# Patient Record
Sex: Male | Born: 1985 | Race: White | Hispanic: No | Marital: Married | Smoking: Never smoker
Health system: Southern US, Community
[De-identification: ages and names within clinical notes are randomized; demographics above are authoritative.]

## PROBLEM LIST (undated history)

## (undated) DIAGNOSIS — M5020 Other cervical disc displacement, unspecified cervical region: Secondary | ICD-10-CM

## (undated) DIAGNOSIS — F988 Other specified behavioral and emotional disorders with onset usually occurring in childhood and adolescence: Secondary | ICD-10-CM

## (undated) DIAGNOSIS — G709 Myoneural disorder, unspecified: Secondary | ICD-10-CM

## (undated) HISTORY — DX: Other cervical disc displacement, unspecified cervical region: M50.20

## (undated) HISTORY — PX: WISDOM TOOTH EXTRACTION: SHX21

## (undated) HISTORY — DX: Other specified behavioral and emotional disorders with onset usually occurring in childhood and adolescence: F98.8

## (undated) HISTORY — DX: Myoneural disorder, unspecified: G70.9

---

## 2013-11-14 ENCOUNTER — Ambulatory Visit
Admission: RE | Admit: 2013-11-14 | Discharge: 2013-11-14 | Disposition: A | Discharge status: Home / Self Care | Payer: Managed Care, Other (non HMO) | Source: Ambulatory Visit | Attending: Emergency Medicine | Admitting: Emergency Medicine

## 2013-11-14 ENCOUNTER — Other Ambulatory Visit: Payer: Self-pay | Admitting: Emergency Medicine

## 2013-11-14 DIAGNOSIS — M542 Cervicalgia: Secondary | ICD-10-CM

## 2013-11-14 DIAGNOSIS — M25512 Pain in left shoulder: Secondary | ICD-10-CM

## 2014-02-14 HISTORY — PX: EPIDURAL BLOCK INJECTION: SHX1516

## 2015-04-30 IMAGING — CR DG SHOULDER 2+V*L*
3 series · 3 of 3 positions shown · non-contrast
Comparison: None.

CLINICAL DATA: 28-year-old male with left shoulder pain.

EXAM:
LEFT SHOULDER - 2+ VIEW

[view not recorded (1 of 3)]
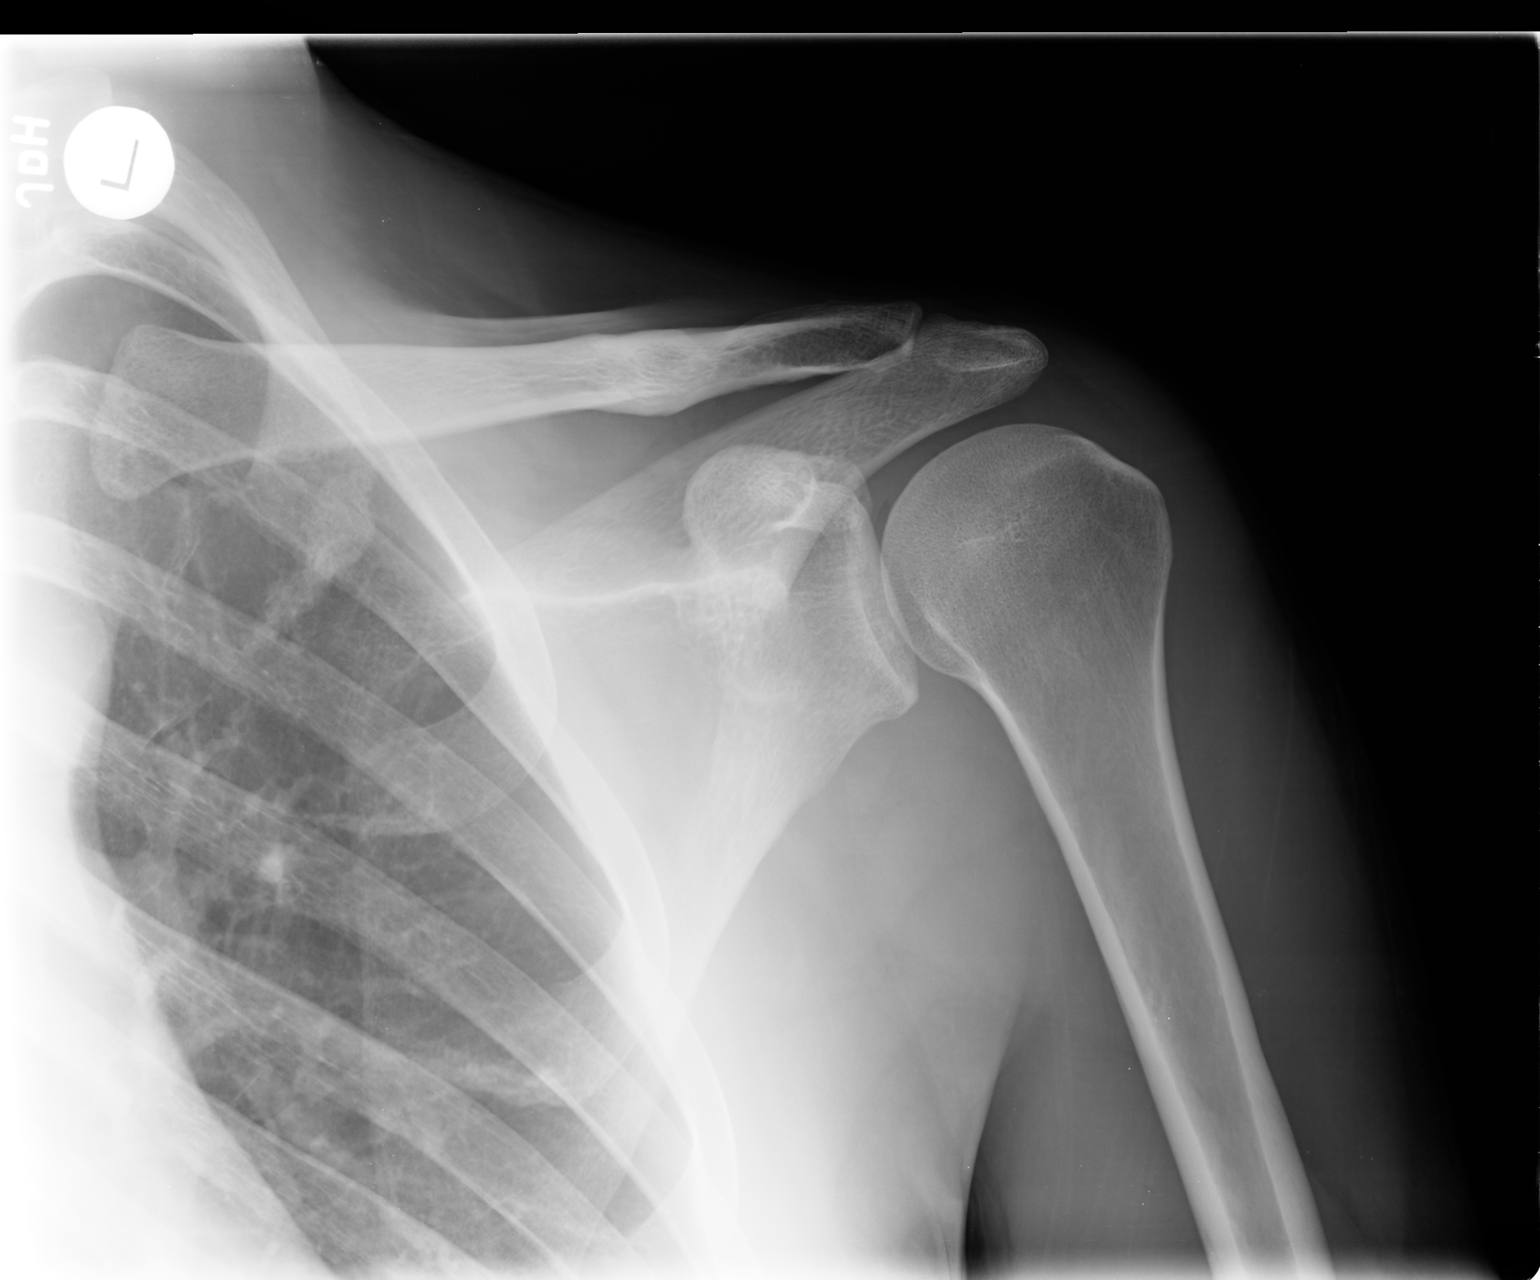

[view not recorded (2 of 3)]
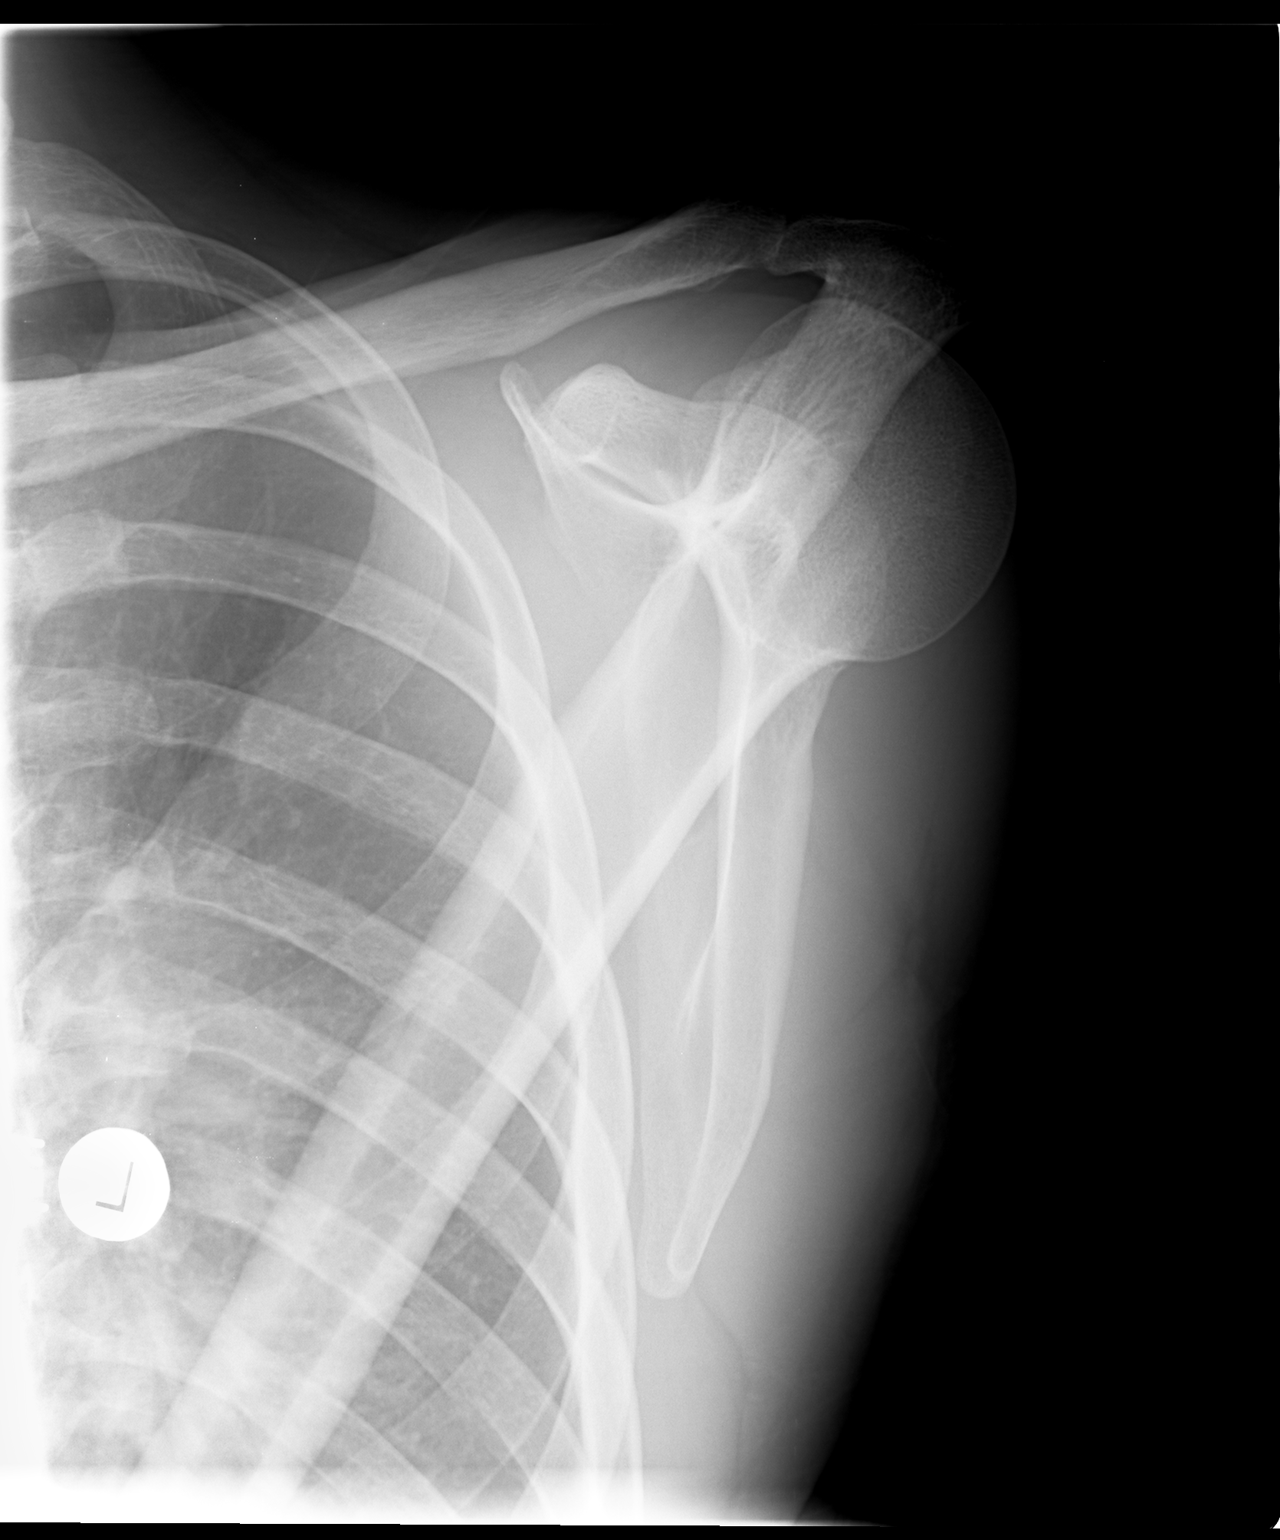

[view not recorded (3 of 3)]
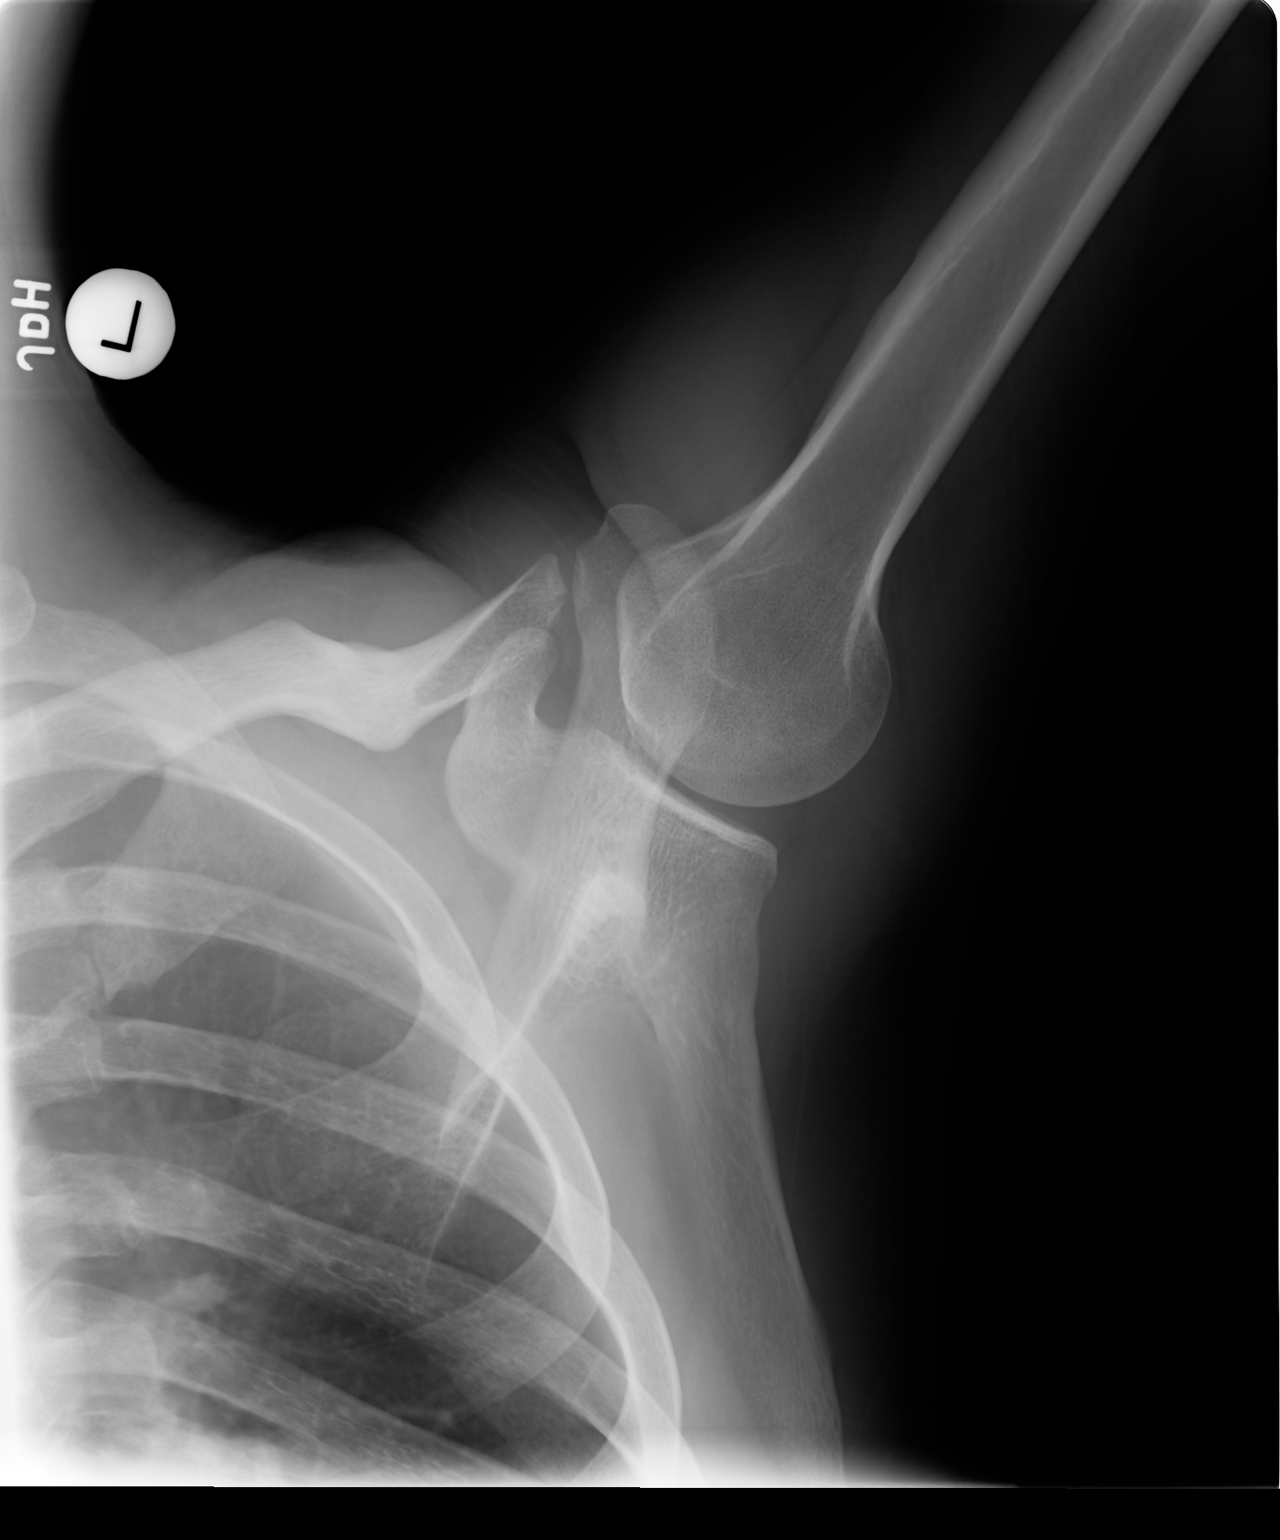

[3 of 3 positions shown; findings below may reference images not displayed]

FINDINGS: No acute bony abnormality. Glenohumeral joint is located. No
significant soft tissue swelling. No joint effusion. No radiopaque
foreign body.

Visualized aspects of the left hemithorax unremarkable.
IMPRESSION: No acute abnormality identified.

## 2015-04-30 IMAGING — CR DG CERVICAL SPINE COMPLETE 4+V
5 series · 5 of 5 positions shown · non-contrast
Comparison: None.

CLINICAL DATA: Neck pain, initial visit. Sharp pain when flexing.
Radiation of pain to left shoulder. No trauma.

EXAM:
CERVICAL SPINE  4+ VIEWS

[view not recorded (1 of 5)]
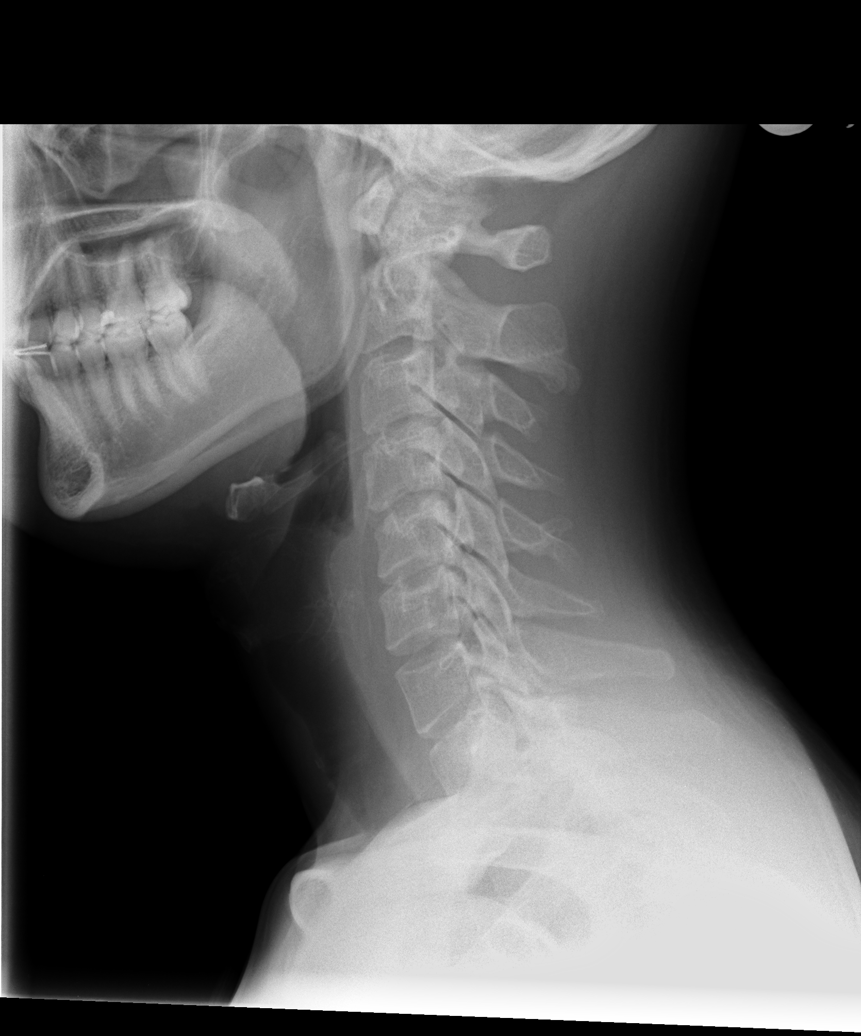

[view not recorded (2 of 5)]
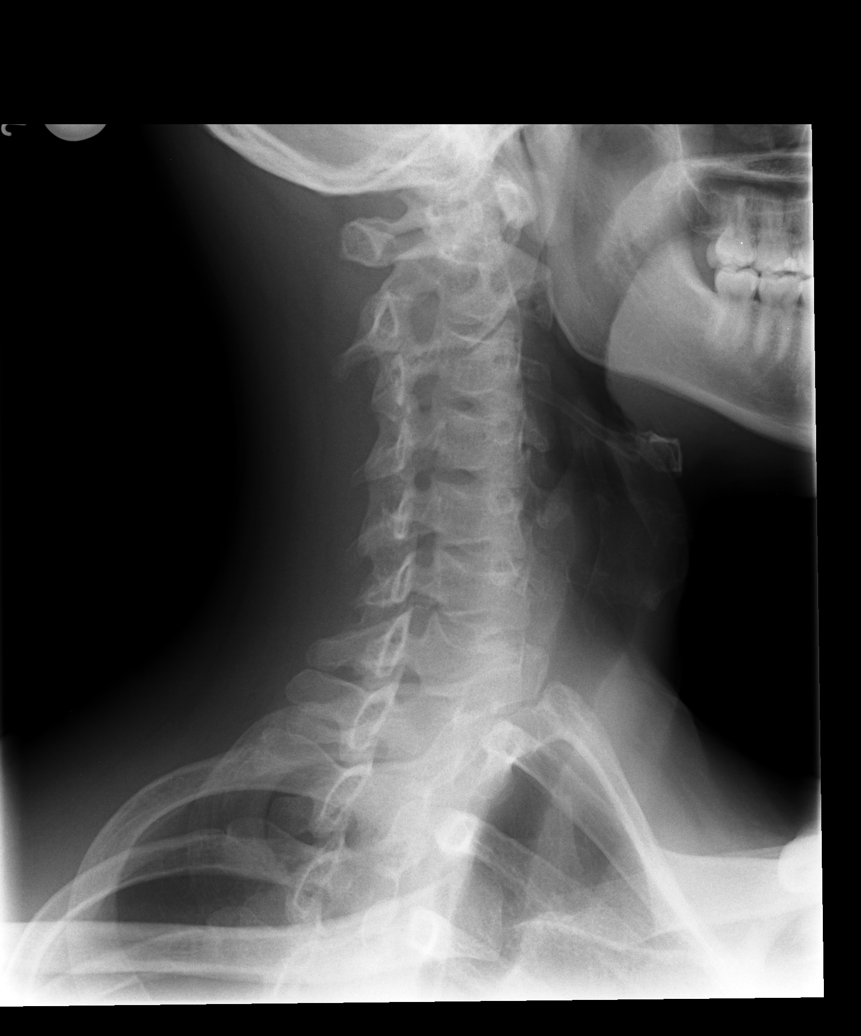

[view not recorded (3 of 5)]
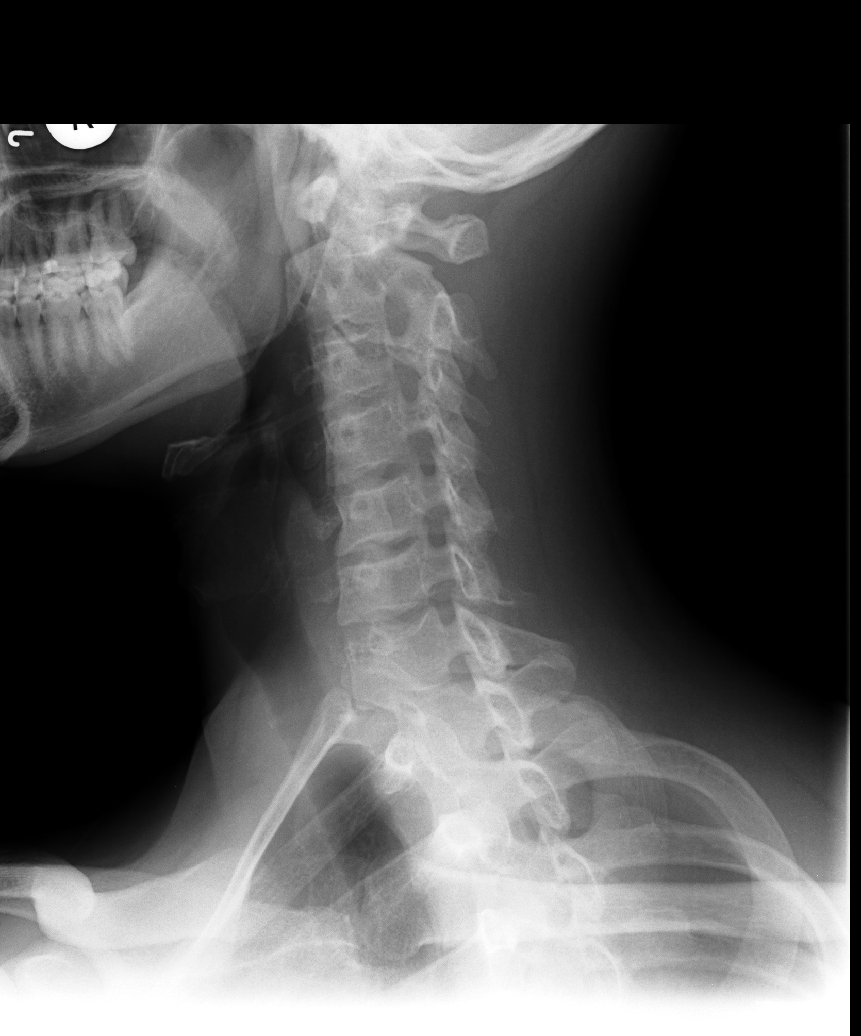

[view not recorded (4 of 5)]
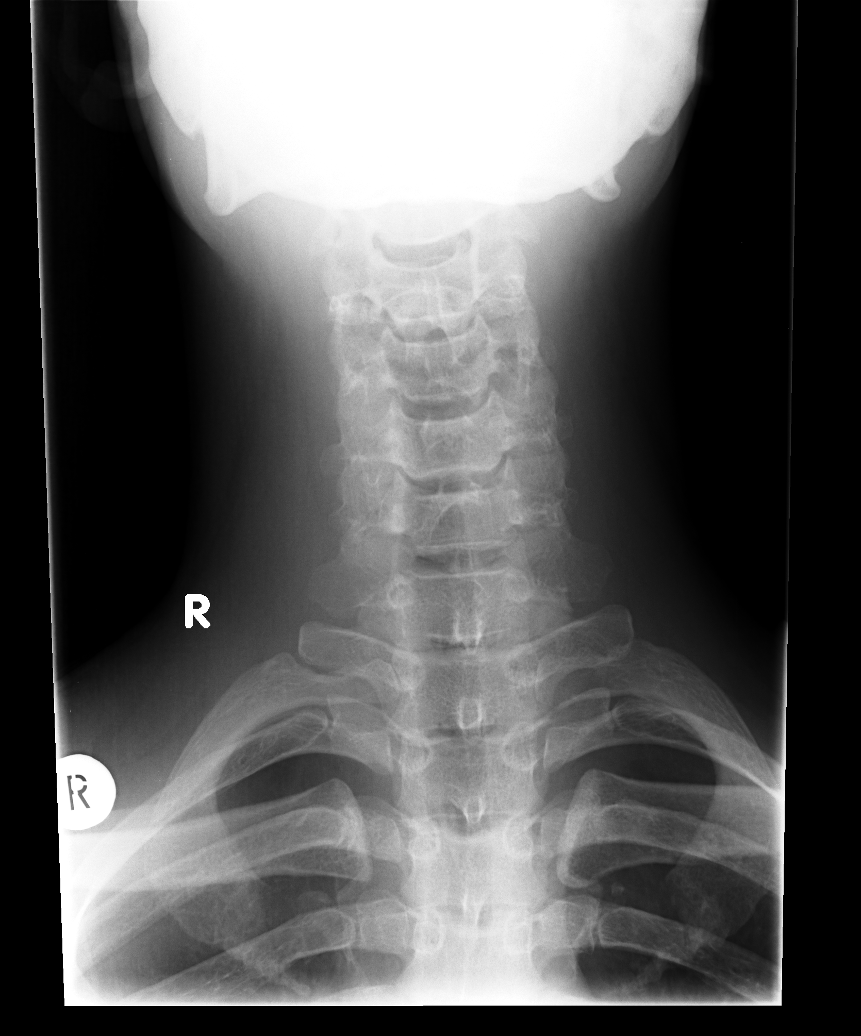

[view not recorded (5 of 5)]
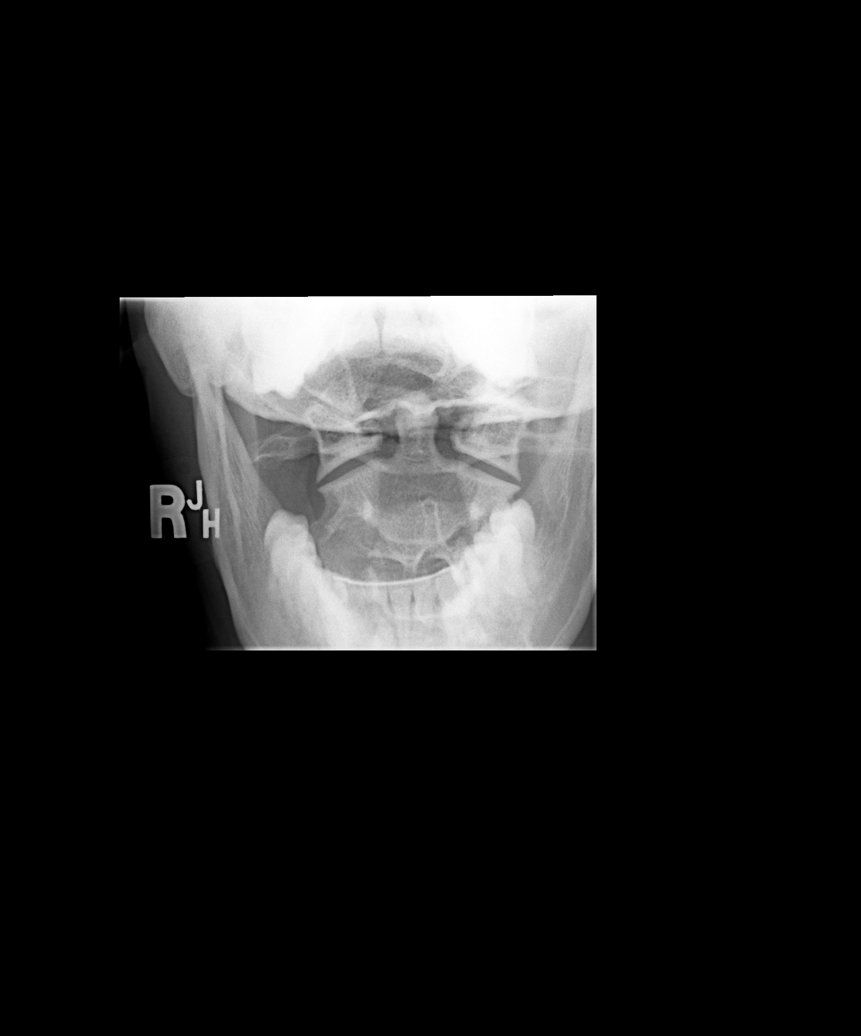

[5 of 5 positions shown; findings below may reference images not displayed]

FINDINGS: Diffuse mild degenerative change of the cervical spine. No acute
bony abnormality identified. No evidence of fracture dislocation.
Pulmonary apices are clear.
IMPRESSION: No acute abnormality.

## 2017-02-14 HISTORY — PX: COLONOSCOPY: SHX174

## 2017-02-14 HISTORY — PX: POLYPECTOMY: SHX149

## 2017-06-20 ENCOUNTER — Encounter: Payer: Self-pay | Admitting: Gastroenterology

## 2017-06-20 ENCOUNTER — Encounter: Payer: Self-pay | Admitting: Family Medicine

## 2017-06-20 ENCOUNTER — Ambulatory Visit: Payer: Commercial Managed Care - PPO | Admitting: Family Medicine

## 2017-06-20 VITALS — BP 102/62 | HR 81 | Temp 97.4°F | Resp 14 | Ht 72.0 in | Wt 200.0 lb

## 2017-06-20 DIAGNOSIS — Z0001 Encounter for general adult medical examination with abnormal findings: Secondary | ICD-10-CM | POA: Diagnosis not present

## 2017-06-20 DIAGNOSIS — Z0184 Encounter for antibody response examination: Secondary | ICD-10-CM

## 2017-06-20 DIAGNOSIS — M5020 Other cervical disc displacement, unspecified cervical region: Secondary | ICD-10-CM | POA: Insufficient documentation

## 2017-06-20 DIAGNOSIS — Z114 Encounter for screening for human immunodeficiency virus [HIV]: Secondary | ICD-10-CM | POA: Diagnosis not present

## 2017-06-20 DIAGNOSIS — L989 Disorder of the skin and subcutaneous tissue, unspecified: Secondary | ICD-10-CM

## 2017-06-20 DIAGNOSIS — Z8659 Personal history of other mental and behavioral disorders: Secondary | ICD-10-CM

## 2017-06-20 DIAGNOSIS — K625 Hemorrhage of anus and rectum: Secondary | ICD-10-CM

## 2017-06-20 DIAGNOSIS — Z13220 Encounter for screening for lipoid disorders: Secondary | ICD-10-CM | POA: Diagnosis not present

## 2017-06-20 LAB — CBC
HEMATOCRIT: 44.2 % (ref 39.0–52.0)
HEMOGLOBIN: 14.9 g/dL (ref 13.0–17.0)
MCHC: 33.7 g/dL (ref 30.0–36.0)
MCV: 88.5 fl (ref 78.0–100.0)
PLATELETS: 156 10*3/uL (ref 150.0–400.0)
RBC: 4.99 Mil/uL (ref 4.22–5.81)
RDW: 13.3 % (ref 11.5–15.5)
WBC: 6.6 10*3/uL (ref 4.0–10.5)

## 2017-06-20 LAB — LIPID PANEL
Cholesterol: 148 mg/dL (ref 0–200)
HDL: 47.6 mg/dL (ref >39.00)
LDL CALC: 83 mg/dL (ref 0–99)
NONHDL: 99.93
Total CHOL/HDL Ratio: 3
Triglycerides: 84 mg/dL (ref 0.0–149.0)
VLDL: 16.8 mg/dL (ref 0.0–40.0)

## 2017-06-20 LAB — COMPREHENSIVE METABOLIC PANEL
ALK PHOS: 36 U/L — AB (ref 39–117)
ALT: 12 U/L (ref 0–53)
AST: 12 U/L (ref 0–37)
Albumin: 4.6 g/dL (ref 3.5–5.2)
BILIRUBIN TOTAL: 0.6 mg/dL (ref 0.2–1.2)
BUN: 14 mg/dL (ref 6–23)
CO2: 30 mEq/L (ref 19–32)
Calcium: 9.8 mg/dL (ref 8.4–10.5)
Chloride: 104 mEq/L (ref 96–112)
Creatinine, Ser: 0.9 mg/dL (ref 0.40–1.50)
GFR: 104.69 mL/min (ref >60.00)
GLUCOSE: 89 mg/dL (ref 70–99)
POTASSIUM: 4.2 meq/L (ref 3.5–5.1)
Sodium: 141 mEq/L (ref 135–145)
TOTAL PROTEIN: 7.1 g/dL (ref 6.0–8.3)

## 2017-06-20 LAB — TSH: TSH: 1.62 u[IU]/mL (ref 0.35–4.50)

## 2017-06-20 NOTE — Progress Notes (Signed)
 Subjective:  Jesus Schneider is a 32 y.o. male who presents today for his annual comprehensive physical exam and to establish care.  HPI:  He has 2 minor complaints today:  1.  Rectal bleeding.  Patient noted bright red blood after bowel movement about a week ago.  This is a one-time occurrence.  Since then has not had any bleeding.  No pain.  No constipation or diarrhea.  Patient has family history of colon cancer in his mother who was diagnosed at age 55. 2.  Skin lesion.  Noticed on the bottom of his right foot about a week ago.  No obvious precipitating events.  No treatments tried.  Lifestyle Diet: No specific diets.  Exercise: Tries to walk everyday. Light running and jogging.   Depression screen PHQ 2/9 06/20/2017  Decreased Interest 0  Down, Depressed, Hopeless 1  PHQ - 2 Score 1   Health Maintenance Due  Topic Date Due  . HIV Screening  07/31/2001  . TETANUS/TDAP  07/31/2005    ROS: Per HPI, otherwise a complete review of systems was negative.   PMH:  The following were reviewed and entered/updated in epic: History reviewed. No pertinent past medical history. Patient Active Problem List   Diagnosis Date Noted  . Cervical herniated disc 06/20/2017  . History of ADHD 06/20/2017   Past Surgical History:  Procedure Laterality Date  . WISDOM TOOTH EXTRACTION      Family History  Problem Relation Age of Onset  . Cancer Mother        colon cancer  . Hypertension Mother   . Heart disease Maternal Grandmother   . Heart disease Maternal Grandfather     Medications- reviewed and updated Current Outpatient Medications  Medication Sig Dispense Refill  . Multiple Vitamin (MULTIVITAMIN) capsule Take 1 capsule by mouth daily.     No current facility-administered medications for this visit.     Allergies-reviewed and updated No Known Allergies  Social History   Socioeconomic History  . Marital status: Married    Spouse name: Not on file  . Number of children:  1  . Years of education: Not on file  . Highest education level: Not on file  Occupational History  . Not on file  Social Needs  . Financial resource strain: Not on file  . Food insecurity:    Worry: Not on file    Inability: Not on file  . Transportation needs:    Medical: Not on file    Non-medical: Not on file  Tobacco Use  . Smoking status: Never Smoker  . Smokeless tobacco: Never Used  Substance and Sexual Activity  . Alcohol use: Yes    Comment: 3 per week  . Drug use: Never  . Sexual activity: Yes    Partners: Female    Birth control/protection: Other-see comments    Comment: birth of son 04/16/2017  Lifestyle  . Physical activity:    Days per week: Not on file    Minutes per session: Not on file  . Stress: Not on file  Relationships  . Social connections:    Talks on phone: Not on file    Gets together: Not on file    Attends religious service: Not on file    Active member of club or organization: Not on file    Attends meetings of clubs or organizations: Not on file    Relationship status: Not on file  Other Topics Concern  . Not on file  Social History   Narrative  . Not on file    Objective:  Physical Exam: BP 102/62   Pulse 81   Temp (!) 97.4 F (36.3 C) (Oral)   Resp 14   Ht 6' (1.829 m)   Wt 200 lb (90.7 kg)   SpO2 99%   BMI 27.12 kg/m   Body mass index is 27.12 kg/m. Wt Readings from Last 3 Encounters:  06/20/17 200 lb (90.7 kg)   Gen: NAD, resting comfortably HEENT: TMs normal bilaterally. OP clear. No thyromegaly noted.  CV: RRR with no murmurs appreciated Pulm: NWOB, CTAB with no crackles, wheezes, or rhonchi GI: Normal bowel sounds present. Soft, Nontender, Nondistended. GU: Normal anus without hemorrhoid or fissure noted. MSK: no edema, cyanosis, or clubbing noted Skin: warm, dry.  3 discrete hyperpigmented vesicles approximately 5 to 10 mm in diameter on sole of right foot.  Please see below picture. Neuro: CN2-12 grossly intact.  Strength 5/5 in upper and lower extremities. Reflexes symmetric and intact bilaterally.  Psych: Normal affect and thought content      Assessment/Plan:  Skin lesion Unclear etiology.  Possibly mild contact dermatitis versus friction dermatitis.  Given that symptoms are improving, we will proceed with watchful waiting.  Discussed return precautions.  Consider biopsy if worsens or does not improve over the next several weeks.  Rectal bleeding Exam today without abnormalities.  Symptoms likely secondary to internal hemorrhoids, however given family history of colon cancer, will place referral to GI today.  Check CBC.  History of ADHD Not currently on any medications.  Check TSH, CBC, and CMET.  Preventative Healthcare: Check HIV antibody.  Check lipid panel.  Check MMR immunity status per patient request.  Patient Counseling(The following topics were reviewed and/or handout was given):  -Nutrition: Stressed importance of moderation in sodium/caffeine intake, saturated fat and cholesterol, caloric balance, sufficient intake of fresh fruits, vegetables, and fiber.  -Stressed the importance of regular exercise.   -Substance Abuse: Discussed cessation/primary prevention of tobacco, alcohol, or other drug use; driving or other dangerous activities under the influence; availability of treatment for abuse.   -Injury prevention: Discussed safety belts, safety helmets, smoke detector, smoking near bedding or upholstery.   -Sexuality: Discussed sexually transmitted diseases, partner selection, use of condoms, avoidance of unintended pregnancy and contraceptive alternatives.   -Dental health: Discussed importance of regular tooth brushing, flossing, and dental visits.  -Health maintenance and immunizations reviewed. Please refer to Health maintenance section.  Return to care in 1 year for next preventative visit.   [name]. Jerline Pain, MD 06/20/2017 11:50 AM

## 2017-06-20 NOTE — Patient Instructions (Signed)
You may have an internal hemorrhoid. We can keep an eye on this for now. If it worsens or comes back we should send you to a GI specialist.  We will check blood work today.  Please let me know if the spot on your foot does not heal over the next few weeks.  Preventive Care 18-39 Years, Male Preventive care refers to lifestyle choices and visits with your health care provider that can promote health and wellness. What does preventive care include?  A yearly physical exam. This is also called an annual well check.  Dental exams once or twice a year.  Routine eye exams. Ask your health care provider how often you should have your eyes checked.  Personal lifestyle choices, including: ? Daily care of your teeth and gums. ? Regular physical activity. ? Eating a healthy diet. ? Avoiding tobacco and drug use. ? Limiting alcohol use. ? Practicing safe sex. What happens during an annual well check? The services and screenings done by your health care provider during your annual well check will depend on your age, overall health, lifestyle risk factors, and family history of disease. Counseling Your health care provider may ask you questions about your:  Alcohol use.  Tobacco use.  Drug use.  Emotional well-being.  Home and relationship well-being.  Sexual activity.  Eating habits.  Work and work Statistician.  Screening You may have the following tests or measurements:  Height, weight, and BMI.  Blood pressure.  Lipid and cholesterol levels. These may be checked every 5 years starting at age 21.  Diabetes screening. This is done by checking your blood sugar (glucose) after you have not eaten for a while (fasting).  Skin check.  Hepatitis C blood test.  Hepatitis B blood test.  Sexually transmitted disease (STD) testing.  Discuss your test results, treatment options, and if necessary, the need for more tests with your health care provider. Vaccines Your health  care provider may recommend certain vaccines, such as:  Influenza vaccine. This is recommended every year.  Tetanus, diphtheria, and acellular pertussis (Tdap, Td) vaccine. You may need a Td booster every 10 years.  Varicella vaccine. You may need this if you have not been vaccinated.  HPV vaccine. If you are 72 or younger, you may need three doses over 6 months.  Measles, mumps, and rubella (MMR) vaccine. You may need at least one dose of MMR.You may also need a second dose.  Pneumococcal 13-valent conjugate (PCV13) vaccine. You may need this if you have certain conditions and have not been vaccinated.  Pneumococcal polysaccharide (PPSV23) vaccine. You may need one or two doses if you smoke cigarettes or if you have certain conditions.  Meningococcal vaccine. One dose is recommended if you are age 66-21 years and a first-year college student living in a residence hall, or if you have one of several medical conditions. You may also need additional booster doses.  Hepatitis A vaccine. You may need this if you have certain conditions or if you travel or work in places where you may be exposed to hepatitis A.  Hepatitis B vaccine. You may need this if you have certain conditions or if you travel or work in places where you may be exposed to hepatitis B.  Haemophilus influenzae type b (Hib) vaccine. You may need this if you have certain risk factors.  Talk to your health care provider about which screenings and vaccines you need and how often you need them. This information is not intended  to replace advice given to you by your health care provider. Make sure you discuss any questions you have with your health care provider. Document Released: 03/29/2001 Document Revised: 10/21/2015 Document Reviewed: 12/02/2014 Elsevier Interactive Patient Education  [employer].

## 2017-06-21 LAB — MEASLES/MUMPS/RUBELLA IMMUNITY
Mumps IgG: 9.8 AU/mL — ABNORMAL LOW
Rubella: 1.24 index
Rubeola IgG: 74.3 AU/mL

## 2017-06-21 LAB — HIV ANTIBODY (ROUTINE TESTING W REFLEX): HIV: NONREACTIVE

## 2017-08-09 ENCOUNTER — Ambulatory Visit: Payer: Commercial Managed Care - PPO | Admitting: Gastroenterology

## 2017-08-09 ENCOUNTER — Encounter: Payer: Self-pay | Admitting: Gastroenterology

## 2017-08-09 VITALS — BP 100/70 | HR 76 | Ht 72.0 in | Wt 209.2 lb

## 2017-08-09 DIAGNOSIS — K625 Hemorrhage of anus and rectum: Secondary | ICD-10-CM

## 2017-08-09 MED ORDER — PEG 3350-KCL-NA BICARB-NACL 420 G PO SOLR: 4000.0000 mL | ORAL | 0 refills | Status: DC

## 2017-08-09 NOTE — Patient Instructions (Addendum)
You will be set up for a colonoscopy. For mild constipation, consider taking citrucel (orange flavored) powder fiber supplement.  This may cause some bloating at first but that usually goes away. Begin with a small spoonful and work your way up to a large, heaping spoonful daily over a week.  Normal BMI (Body Mass Index- based on height and weight) is between 19 and 25. Your BMI today is Body mass index is 28.37 kg/m. Marland Kitchen Please consider follow up  regarding your BMI with your Primary Care Provider.

## 2017-08-09 NOTE — Progress Notes (Signed)
HPI: This is a 32 year old manvery pleasant who was referred to me by Vivi Barrack, MD  to evaluate intermittent rectal bleeding.    Chief complaint is intermittent mild rectal bleeding  Intermittently over the past 6 or 7 years he has had very minor rectal bleeding at times of bowel movements.  This happens quite infrequently once or twice a year.  Can often happen after a difficult to move bowel movement.  He does think he had hemorrhoids when he was in college.  He has not felt hemorrhoidal type bumps however.  His weight has been overall stable  vyvance diarrhea 2010 until march 2019.  Has been a bit constipated since then, but that is getting better.  His mother had crc in her early 4s; she was treated by surgery and chemotherapy;   Old Data Reviewed:  CBC 06/2017 was normal.     Review of systems: Pertinent positive and negative review of systems were noted in the above HPI section. All other review negative.   Past Medical History:  Diagnosis Date  . ADD (attention deficit disorder)   . Cervical herniated disc     Past Surgical History:  Procedure Laterality Date  . WISDOM TOOTH EXTRACTION      Current Outpatient Medications  Medication Sig Dispense Refill  . Multiple Vitamin (MULTIVITAMIN) capsule Take 1 capsule by mouth daily.     No current facility-administered medications for this visit.     Allergies as of 08/09/2017  . (No Known Allergies)    Family History  Problem Relation Age of Onset  . Hypertension Mother   . Colon cancer Mother 92  . Colon polyps Mother   . Heart disease Maternal Grandmother   . Heart disease Maternal Grandfather     Social History   Socioeconomic History  . Marital status: Married    Spouse name: Not on file  . Number of children: 1  . Years of education: Not on file  . Highest education level: Not on file  Occupational History  . Occupation: Chief Financial Officer  Social Needs  . Financial resource strain: Not on file   . Food insecurity:    Worry: Not on file    Inability: Not on file  . Transportation needs:    Medical: Not on file    Non-medical: Not on file  Tobacco Use  . Smoking status: Never Smoker  . Smokeless tobacco: Never Used  Substance and Sexual Activity  . Alcohol use: Yes    Comment: 1-2 drinks daily  . Drug use: Never  . Sexual activity: Yes    Partners: Female    Birth control/protection: Other-see comments    Comment: birth of son 04/16/2017  Lifestyle  . Physical activity:    Days per week: Not on file    Minutes per session: Not on file  . Stress: Not on file  Relationships  . Social connections:    Talks on phone: Not on file    Gets together: Not on file    Attends religious service: Not on file    Active member of club or organization: Not on file    Attends meetings of clubs or organizations: Not on file    Relationship status: Not on file  . Intimate partner violence:    Fear of current or ex partner: Not on file    Emotionally abused: Not on file    Physically abused: Not on file    Forced sexual activity: Not on file  Other Topics Concern  . Not on file  Social History Narrative  . Not on file     Physical Exam: BP 100/70   Pulse 76   Ht 6' (1.829 m)   Wt 209 lb 3.2 oz (94.9 kg)   BMI 28.37 kg/m  Constitutional: generally well-appearing Psychiatric: alert and oriented x3 Eyes: extraocular movements intact Mouth: oral pharynx moist, no lesions Neck: supple no lymphadenopathy Cardiovascular: heart regular rate and rhythm Lungs: clear to auscultation bilaterally Abdomen: soft, nontender, nondistended, no obvious ascites, no peritoneal signs, normal bowel sounds Extremities: no lower extremity edema bilaterally Skin: no lesions on visible extremities Rectal examination: No external anal hemorrhoids, no obvious anal fissures, no distal rectal masses palpated, stool was brown and not checked for Hemoccult  Assessment and plan: 32 y.o. male with  minor rectal bleeding  I think this is probably benign, anorectal in origin.  Perhaps small internal hemorrhoid that is not palpable.  His mother had colon cancer in her very early 94W and I certainly think we should proceed with colonoscopy to be sure that we are not missing anything more significant here.  I also recommended he consider trying fiber supplements for mild functional sounding constipation   Please see the "Patient Instructions" section for addition details about the plan.   Owens Loffler, MD [facility] 08/09/2017, 8:31 AM  Cc: Vivi Barrack, MD

## 2017-10-20 ENCOUNTER — Ambulatory Visit (AMBULATORY_SURGERY_CENTER): Payer: Commercial Managed Care - PPO | Admitting: Gastroenterology

## 2017-10-20 ENCOUNTER — Encounter: Payer: Self-pay | Admitting: Gastroenterology

## 2017-10-20 VITALS — BP 96/56 | HR 60 | Temp 99.5°F | Resp 15 | Ht 72.0 in | Wt 209.0 lb

## 2017-10-20 DIAGNOSIS — Z800 Family history of malignant neoplasm of digestive organs: Secondary | ICD-10-CM

## 2017-10-20 DIAGNOSIS — D122 Benign neoplasm of ascending colon: Secondary | ICD-10-CM | POA: Diagnosis not present

## 2017-10-20 DIAGNOSIS — K625 Hemorrhage of anus and rectum: Secondary | ICD-10-CM

## 2017-10-20 MED ORDER — SODIUM CHLORIDE 0.9 % IV SOLN: 500.0000 mL | Freq: Once | INTRAVENOUS | Status: DC

## 2017-10-20 NOTE — Op Note (Signed)
[facility] Patient Name: Jesus Schneider Procedure Date: 10/20/2017 10:46 AM MRN: [id number] Endoscopist: Milus Banister , MD Age: 32 Referring MD:  Date of Birth: 1985/08/28 Gender: Male Account #: [id number] Procedure:                Colonoscopy Indications:              Rectal bleeding, also mother was diagnosed with                            colon cancer in her early 23s. Medicines:                Monitored Anesthesia Care Procedure:                Pre-Anesthesia Assessment:                           - Prior to the procedure, a History and Physical                            was performed, and patient medications and                            allergies were reviewed. The patient's tolerance of                            previous anesthesia was also reviewed. The risks                            and benefits of the procedure and the sedation                            options and risks were discussed with the patient.                            All questions were answered, and informed consent                            was obtained. Prior Anticoagulants: The patient has                            taken no previous anticoagulant or antiplatelet                            agents. ASA Grade Assessment: II - A patient with                            mild systemic disease. After reviewing the risks                            and benefits, the patient was deemed in                            satisfactory condition to undergo the procedure.  After obtaining informed consent, the colonoscope                            was passed under direct vision. Throughout the                            procedure, the patient's blood pressure, pulse, and                            oxygen saturations were monitored continuously. The                            Colonoscope was introduced through the anus and                            advanced to the the cecum,  identified by                            appendiceal orifice and ileocecal valve. The                            colonoscopy was performed without difficulty. The                            patient tolerated the procedure well. The quality                            of the bowel preparation was good. The ileocecal                            valve, appendiceal orifice, and rectum were                            photographed. Scope In: 10:52:28 AM Scope Out: 11:03:03 AM Scope Withdrawal Time: 0 hours 8 minutes 7 seconds  Total Procedure Duration: 0 hours 10 minutes 35 seconds  Findings:                 Three sessile polyps were found in the ascending                            colon. The polyps were 2 to 6 mm in size. These                            polyps were removed with a cold snare. Resection                            and retrieval were complete.                           The exam was otherwise without abnormality on                            direct and retroflexion views. Complications:  No immediate complications. Estimated blood loss:                            None. Estimated Blood Loss:     Estimated blood loss: none. Impression:               - Three 2 to 6 mm polyps in the ascending colon,                            removed with a cold snare. Resected and retrieved.                           - The examination was otherwise normal on direct                            and retroflexion views.                           - Your mild bleeding was not related to the polyps                            removed today. Much more likely is that the                            bleeding was from small hemorrhoids that have since                            resolved. Recommendation:           - Patient has a contact number available for                            emergencies. The signs and symptoms of potential                            delayed complications were discussed with the                             patient. Return to normal activities tomorrow.                            Written discharge instructions were provided to the                            patient.                           - Resume previous diet.                           - Continue present medications.                           You will receive a letter within 2-3 weeks with the  pathology results and my final recommendations.                           If the polyp(s) is proven to be 'pre-cancerous' on                            pathology, you will need repeat colonoscopy in 3-5                            years. If the polyp(s) is NOT 'precancerous' on                            pathology then you should repeat colon cancer                            screening at age 68 given your signficant family                            hitory of colon cancer. Milus Banister, MD 10/20/2017 11:07:02 AM This report has been signed electronically.

## 2017-10-20 NOTE — Patient Instructions (Signed)
   Information on polyps given to you today  Await pathology results on polyps removed    YOU HAD AN ENDOSCOPIC PROCEDURE TODAY AT THE [facility]:   Refer to the procedure report that was given to you for any specific questions about what was found during the examination.  If the procedure report does not answer your questions, please call your gastroenterologist to clarify.  If you requested that your care partner not be given the details of your procedure findings, then the procedure report has been included in a sealed envelope for you to review at your convenience later.  YOU SHOULD EXPECT: Some feelings of bloating in the abdomen. Passage of more gas than usual.  Walking can help get rid of the air that was put into your GI tract during the procedure and reduce the bloating. If you had a lower endoscopy (such as a colonoscopy or flexible sigmoidoscopy) you may notice spotting of blood in your stool or on the toilet paper. If you underwent a bowel prep for your procedure, you may not have a normal bowel movement for a few days.  Please Note:  You might notice some irritation and congestion in your nose or some drainage.  This is from the oxygen used during your procedure.  There is no need for concern and it should clear up in a day or so.  SYMPTOMS TO REPORT IMMEDIATELY:   Following lower endoscopy (colonoscopy or flexible sigmoidoscopy):  Excessive amounts of blood in the stool  Significant tenderness or worsening of abdominal pains  Swelling of the abdomen that is new, acute  Fever of 100F or higher    For urgent or emergent issues, a gastroenterologist can be reached at any hour by calling [phone].   DIET:  We do recommend a small meal at first, but then you may proceed to your regular diet.  Drink plenty of fluids but you should avoid alcoholic beverages for 24 hours.  ACTIVITY:  You should plan to take it easy for the rest of today and you should NOT DRIVE  or use heavy machinery until tomorrow (because of the sedation medicines used during the test).    FOLLOW UP: Our staff will call the number listed on your records the next business day following your procedure to check on you and address any questions or concerns that you may have regarding the information given to you following your procedure. If we do not reach you, we will leave a message.  However, if you are feeling well and you are not experiencing any problems, there is no need to return our call.  We will assume that you have returned to your regular daily activities without incident.  If any biopsies were taken you will be contacted by phone or by letter within the next 1-3 weeks.  Please call us at [phone] if you have not heard about the biopsies in 3 weeks.    SIGNATURES/CONFIDENTIALITY: You and/or your care partner have signed paperwork which will be entered into your electronic medical record.  These signatures attest to the fact that that the information above on your After Visit Summary has been reviewed and is understood.  Full responsibility of the confidentiality of this discharge information lies with you and/or your care-partner. 

## 2017-10-20 NOTE — Progress Notes (Signed)
Report given to PACU, vss 

## 2017-10-20 NOTE — Progress Notes (Signed)
Called to room to assist during endoscopic procedure.  Patient ID and intended procedure confirmed with present staff. Received instructions for my participation in the procedure from the performing physician.  

## 2017-10-23 ENCOUNTER — Telehealth: Payer: Self-pay

## 2017-10-23 NOTE — Telephone Encounter (Signed)
  Follow up Call-  Call back number 10/20/2017  Post procedure Call Back phone  # [phone]  Permission to leave phone message Yes  Some recent data might be hidden     Patient questions:  Do you have a fever, pain , or abdominal swelling? No. Pain Score  0 *  Have you tolerated food without any problems? Yes.    Have you been able to return to your normal activities? Yes.    Do you have any questions about your discharge instructions: Diet   No. Medications  No. Follow up visit  No.  Do you have questions or concerns about your Care? No.  Actions: * If pain score is 4 or above: No action needed, pain <4.

## 2017-10-26 ENCOUNTER — Encounter: Payer: Self-pay | Admitting: Gastroenterology

## 2018-01-25 ENCOUNTER — Encounter: Payer: Self-pay | Admitting: Family Medicine

## 2018-01-25 ENCOUNTER — Ambulatory Visit: Payer: Commercial Managed Care - PPO | Admitting: Family Medicine

## 2018-01-25 VITALS — BP 110/76 | HR 90 | Temp 99.2°F | Ht 72.0 in | Wt 210.0 lb

## 2018-01-25 DIAGNOSIS — H66003 Acute suppurative otitis media without spontaneous rupture of ear drum, bilateral: Secondary | ICD-10-CM

## 2018-01-25 DIAGNOSIS — R6889 Other general symptoms and signs: Secondary | ICD-10-CM

## 2018-01-25 LAB — POCT INFLUENZA A/B: Influenza A, POC: NEGATIVE

## 2018-01-25 MED ORDER — METHYLPREDNISOLONE ACETATE 80 MG/ML IJ SUSP
80.0000 mg | Freq: Once | INTRAMUSCULAR | Status: AC
  Administered 2018-01-25: 80 mg via INTRAMUSCULAR

## 2018-01-25 MED ORDER — AMOXICILLIN-POT CLAVULANATE 875-125 MG PO TABS: 1.0000 | ORAL_TABLET | Freq: Two times a day (BID) | ORAL | 0 refills | Status: DC

## 2018-01-25 NOTE — Patient Instructions (Signed)
It was very nice to see you today!  You have an ear infection.  Your flu test was negative.  Please start the Augmentin.  Please take 1 pill twice daily for the next 1 week.  We will give you a injection of an anti-inflammatory steroid today as well.  You can continue taking Tylenol and/your ibuprofen as needed.  Please stay well-hydrated.  Please let me know if your symptoms worsen or do not improve over the next several days.  Take care, Dr Jerline Pain

## 2018-01-25 NOTE — Progress Notes (Signed)
   Subjective:  Jesus Schneider is a 32 y.o. male who presents today for same-day appointment with a chief complaint of fever.   HPI:  Fever, Acute problem Symptoms started about 2 weeks ago.  He was with his family over Thanksgiving when he started developing sore throat, rhinorrhea, and ear pain/pressure.  Went to an urgent care and was started on amoxicillin 500 mg twice daily for 7 days.  Symptoms modestly improved while on medication however over the last couple of days symptoms have significantly worsened.  Starting yesterday started having high fevers to 102 and 103 F.  Ear pain has been severe.  Also some sore throat.  Tylenol and Motrin have helped.  No other obvious alleviating or aggravating factors. Associated with sore throat, fever, ear pressure.   ROS: Per HPI  PMH: He reports that he has never smoked. He has never used smokeless tobacco. He reports current alcohol use. He reports that he does not use drugs.  Objective:  Physical Exam: BP 110/76 (BP Location: Left Arm, Patient Position: Sitting, Cuff Size: Normal)   Pulse 90   Temp 99.2 F (37.3 C) (Oral)   Ht 6' (1.829 m)   Wt 210 lb (95.3 kg)   SpO2 99%   BMI 28.48 kg/m   Gen: NAD, resting comfortably HEENT: TMs bulging and erythematous bilaterally.  OP erythematous without exudate.  Nasal mucosa boggy and erythematous bilaterally with clear discharge. CV: RRR with no murmurs appreciated Pulm: NWOB, CTAB with no crackles, wheezes, or rhonchi  Results for orders placed or performed in visit on 01/25/18 (from the past 24 hour(s))  POCT Influenza A/B     Status: None   Collection Time: 01/25/18 11:31 AM  Result Value Ref Range   Influenza A, POC Negative Negative    Assessment/Plan:  Bilateral otitis media Flu test negative.  Exam consistent with otitis media.  He has failed oral amoxicillin.  Start Augmentin twice daily for the next 7 days.  Will give 80 mg IM Depo-Medrol today.  Recommended Tylenol and/or  ibuprofen as needed.  Encouraged good oral hydration.  Discussed reasons to return to care or seek emergent care.  [name]. Jerline Pain, MD 01/25/2018 12:01 PM

## 2018-01-26 ENCOUNTER — Ambulatory Visit: Payer: Self-pay

## 2018-01-26 ENCOUNTER — Ambulatory Visit: Payer: Commercial Managed Care - PPO | Admitting: Family Medicine

## 2018-01-26 ENCOUNTER — Encounter: Payer: Self-pay | Admitting: Family Medicine

## 2018-01-26 VITALS — BP 108/72 | HR 76 | Temp 98.6°F | Ht 72.0 in | Wt 208.2 lb

## 2018-01-26 DIAGNOSIS — H109 Unspecified conjunctivitis: Secondary | ICD-10-CM | POA: Diagnosis not present

## 2018-01-26 MED ORDER — POLYMYXIN B-TRIMETHOPRIM 10000-0.1 UNIT/ML-% OP SOLN: 2.0000 [drp] | OPHTHALMIC | 0 refills | Status: DC

## 2018-01-26 MED ORDER — OLOPATADINE HCL 0.2 % OP SOLN: OPHTHALMIC | 0 refills | Status: DC

## 2018-01-26 NOTE — Telephone Encounter (Signed)
Noted  

## 2018-01-26 NOTE — Patient Instructions (Signed)
It was very nice to see you today!  You have a viral conjunctivitis.  This is very contagious however typically does not need antibiotics to treat.  I will send in an eyedrop called Pataday to help you with your itching.  It is possible that this could turn into a bacterial infection.  Please start the Polytrim drops if your symptoms do not improve in the next couple of days or if you have any worsening of symptoms.  Take care, Dr Jerline Pain

## 2018-01-26 NOTE — Telephone Encounter (Signed)
Incoming call from Patient with complaint of waking up with eye crusted shut.  Sclera  Is pink towards the corner of the eyes.States eyes are itchy.   States the eyelids are swollen slightly.   Sore and tender.  No issues with vision.  Patient does not wear contacts.  Reports fever,congestion, and drainage.  Fever of 101.8 right ear.  102 left  Ear.  Notes more redness under bilateral eye lids.  Scheduled appointment for Today, 01/26/18 @1 :00 pm Patient is to arrive @ 12:45pm Patient voices understanding.  Reviewed care advice with Patient.  Voiced understanding.    Reason for Disposition . [1] Taking oral antibiotic > 48 hours (2 days) AND [2] pus in eye persists  Answer Assessment - Initial Assessment Questions 1. EYE DISCHARGE: "Is the discharge in one or both eyes?" "What color is it?" "How much is there?" "When did the discharge start?"      A littlitle pink.   2. REDNESS OF SCLERA: "Is the redness in one or both eyes?" "When did the redness start?"      Pink  3. EYELIDS: "Are the eyelids red or swollen?" If so, ask: "How much?"     A little bit , sore,  tender 4. VISION: "Is there any difficulty seeing clearly?"      No issues 5. PAIN: "Is there any pain? If so, ask: "How bad is it?" (Scale 1-10; or mild, moderate, severe)    - MILD (1-3): doesn't interfere with normal activities     - MODERATE (4-7): interferes with normal activities or awakens from sleep    - SEVERE (8-10): excruciating pain, unable to do any normal activities       mild 6. CONTACT LENS: "Do you wear contacts?"     no 7. OTHER SYMPTOMS: "Do you have any other symptoms?" (e.g., fever, runny nose, cough)     Fever, congested. Drainage  8. PREGNANCY: "Is there any chance you are pregnant?" "When was your last menstrual period?"     *No Answer*  Protocols used: EYE - PUS OR DISCHARGE-A-AH

## 2018-01-26 NOTE — Progress Notes (Signed)
   Subjective:  Jesus Schneider is a 32 y.o. male who presents today for same-day appointment with a chief complaint of pink eye.   HPI:  Pink Eye, Acute problem Started last night.  Worsening over that time.  Woke up this morning with crusted shut left eye with significant amount of watery drainage.  His children been sick with similar symptoms.  He was seen yesterday and diagnosed with otitis media and started on a course of Augmentin.  He was also given a dose of IM Depo-Medrol.  His other symptoms have improved.  No pain.  No drainage.  No other obvious alleviating or aggravating factors.  ROS: Per HPI  PMH: He reports that he has never smoked. He has never used smokeless tobacco. He reports current alcohol use. He reports that he does not use drugs.  Objective:  Physical Exam: BP 108/72 (BP Location: Left Arm, Patient Position: Sitting, Cuff Size: Normal)   Pulse 76   Temp 98.6 F (37 C) (Oral)   Ht 6' (1.829 m)   Wt 208 lb 4 oz (94.5 kg)   SpO2 99%   BMI 28.24 kg/m   Gen: NAD, resting comfortably HEENT: Right eye normal.  Left eye with conjunctival erythema.  No discharge.  Extraocular eye movements intact bilaterally without pain.  No photophobia.  Visual acuity grossly intact. CV: RRR with no murmurs appreciated Pulm: NWOB, CTAB with no crackles, wheezes, or rhonchi  Assessment/Plan:  Conjunctivitis Likely viral given his viral syndrome.  No red flags.  We will treat symptomatically with Pataday drops.  We will also send in a "pocket prescription" for Polytrim drops with strict instruction to not start in with symptoms worsen or do not improve in the next 2 days.  Discussed reasons to return to care and seek emergent care.  [name]. Jerline Pain, MD 01/26/2018 1:27 PM

## 2018-03-08 ENCOUNTER — Encounter: Payer: Self-pay | Admitting: Family Medicine

## 2018-03-08 ENCOUNTER — Ambulatory Visit (INDEPENDENT_AMBULATORY_CARE_PROVIDER_SITE_OTHER): Payer: Commercial Managed Care - PPO | Admitting: Family Medicine

## 2018-03-08 VITALS — BP 110/72 | HR 78 | Temp 98.3°F | Ht 72.0 in | Wt 208.8 lb

## 2018-03-08 DIAGNOSIS — M5441 Lumbago with sciatica, right side: Secondary | ICD-10-CM

## 2018-03-08 MED ORDER — DICLOFENAC SODIUM 75 MG PO TBEC: 75.0000 mg | DELAYED_RELEASE_TABLET | Freq: Two times a day (BID) | ORAL | 0 refills | Status: DC

## 2018-03-08 MED ORDER — CYCLOBENZAPRINE HCL 10 MG PO TABS: 10.0000 mg | ORAL_TABLET | Freq: Three times a day (TID) | ORAL | 0 refills | Status: DC | PRN

## 2018-03-08 NOTE — Progress Notes (Signed)
   Subjective:  Jesus Schneider is a 33 y.o. male who presents today for same-day appointment with a chief complaint of back pain.   HPI:  Back Pain, Acute problem Symptoms started about 3 to 4 days ago however have significantly worsened over the last 2 days.  He went skiing about a week ago.  He said he fell once but did not have any immediate pain to the area.  No other obvious precipitating or triggering events.  Pain mostly located in right lower back.  Occasionally radiates into his right leg.  He has tried Advil with modest improvement.  No reported weakness or numbness.  No reported bowel or bladder incontinence.  No reported urinary retention.  Pain is worse with sitting forward and with sitting for prolonged periods of time.  No other obvious alleviating or aggravating factors.   ROS: Per HPI  PMH: He reports that he has never smoked. He has never used smokeless tobacco. He reports current alcohol use. He reports that he does not use drugs.  Objective:  Physical Exam: BP 110/72 (BP Location: Left Arm, Patient Position: Sitting, Cuff Size: Normal)   Pulse 78   Temp 98.3 F (36.8 C) (Oral)   Ht 6' (1.829 m)   Wt 208 lb 12.8 oz (94.7 kg)   SpO2 99%   BMI 28.32 kg/m   Gen: NAD, resting comfortably MSK: -Back: No deformities.  No midline tenderness.  Tender to palpation along right lower lumbar paraspinal muscles.  Spinous processes nontender to palpation and percussion. -Lower extremities: No deformities.  Strength 5 out of 5 throughout.  Sensation light touch intact throughout.  Straight leg raise negative bilaterally.  Assessment/Plan:  Low back pain with right-sided sciatica Likely muscular strain/spasm.  No red flags.  Will start diclofenac 75 mg twice daily for the next 1 to 2 weeks.  Also start Flexeril 10 mg 3 times daily as needed.  Recommended heating pad as needed.  Discussed home exercise program and handout was given.  Discussed reasons to return to care and seek  emergent care.  Follow-up as needed.  [name]. Jerline Pain, MD 03/08/2018 11:53 AM

## 2018-03-08 NOTE — Patient Instructions (Signed)
It was very nice to see you today!  Take the diclofenac twice daily for the next week.  Please take the Flexeril 3 times daily as needed.  Please work on the exercises.  Please let me know if your symptoms worsen or do not improve in the next 7 to 10 days.  Take care, Dr Jerline Pain

## 2018-06-22 ENCOUNTER — Encounter: Payer: Commercial Managed Care - PPO | Admitting: Family Medicine

## 2018-09-28 ENCOUNTER — Encounter: Payer: Commercial Managed Care - PPO | Admitting: Family Medicine

## 2018-10-09 ENCOUNTER — Other Ambulatory Visit: Payer: Self-pay

## 2018-10-09 ENCOUNTER — Ambulatory Visit (INDEPENDENT_AMBULATORY_CARE_PROVIDER_SITE_OTHER): Payer: Commercial Managed Care - PPO | Admitting: Family Medicine

## 2018-10-09 VITALS — Temp 101.0°F

## 2018-10-09 DIAGNOSIS — Z20822 Contact with and (suspected) exposure to covid-19: Secondary | ICD-10-CM

## 2018-10-09 DIAGNOSIS — R509 Fever, unspecified: Secondary | ICD-10-CM

## 2018-10-09 DIAGNOSIS — Z20828 Contact with and (suspected) exposure to other viral communicable diseases: Secondary | ICD-10-CM | POA: Diagnosis not present

## 2018-10-09 NOTE — Progress Notes (Signed)
    Chief Complaint:  Jesus Schneider is a 33 y.o. male who presents today for a virtual office visit with a chief complaint of fever.   Assessment/Plan:  Fever/COVID-19 exposure Will test. No red flags.  Advised patient to stay well-hydrated.  Also recommended he could take Tylenol and/or Motrin as needed.  Discussed reasons to return to care and seek emergent care.  Follow-up as needed.    Subjective:  HPI:  Fever Started yesterday. Associated symptoms include sore throat, lethargy, malaise, headache, body aches. Patient was exposed to another patient that had COVID about 4 days ago.  His wife and child are doing well without symptoms.  No specific treatments tried.  ROS: Per HPI  PMH: He reports that he has never smoked. He has never used smokeless tobacco. He reports current alcohol use. He reports that he does not use drugs.      Objective/Observations  Physical Exam: Gen: NAD, resting comfortably Pulm: Normal work of breathing Neuro: Grossly normal, moves all extremities Psych: Normal affect and thought content  Virtual Visit via Video   I connected with Jesus Schneider on 10/09/18 at 11:00 AM EDT by a video enabled telemedicine application and verified that I am speaking with the correct person using two identifiers. I discussed the limitations of evaluation and management by telemedicine and the availability of in person appointments. The patient expressed understanding and agreed to proceed.   Patient location: Home Provider location: [place] participating in the virtual visit: Myself and Patient     [name]. Jerline Pain, MD 10/09/2018 11:14 AM

## 2018-10-11 ENCOUNTER — Encounter: Payer: Commercial Managed Care - PPO | Admitting: Family Medicine

## 2018-10-11 LAB — NOVEL CORONAVIRUS, NAA: SARS-CoV-2, NAA: DETECTED — AB

## 2018-12-14 ENCOUNTER — Ambulatory Visit (INDEPENDENT_AMBULATORY_CARE_PROVIDER_SITE_OTHER): Payer: Commercial Managed Care - PPO | Admitting: Family Medicine

## 2018-12-14 ENCOUNTER — Encounter: Payer: Self-pay | Admitting: Family Medicine

## 2018-12-14 ENCOUNTER — Other Ambulatory Visit: Payer: Self-pay

## 2018-12-14 VITALS — BP 110/62 | HR 74 | Temp 98.0°F | Ht 72.0 in | Wt 187.8 lb

## 2018-12-14 DIAGNOSIS — E663 Overweight: Secondary | ICD-10-CM

## 2018-12-14 DIAGNOSIS — Z6825 Body mass index (BMI) 25.0-25.9, adult: Secondary | ICD-10-CM

## 2018-12-14 DIAGNOSIS — Z23 Encounter for immunization: Secondary | ICD-10-CM

## 2018-12-14 DIAGNOSIS — Z0001 Encounter for general adult medical examination with abnormal findings: Secondary | ICD-10-CM | POA: Diagnosis not present

## 2018-12-14 DIAGNOSIS — M542 Cervicalgia: Secondary | ICD-10-CM

## 2018-12-14 NOTE — Patient Instructions (Signed)
It was very nice to see you today!  Please try taking naproxen twice per day for the next week or so.  Please work on exercises.  Keep up the good work with your diet and exercise.  We will give you your tetanus vaccine today.  Come back to see me in 1 year for your next physical, or sooner if needed.  Take care, Dr Jerline Pain  Please try these tips to maintain a healthy lifestyle:   Eat at least 3 REAL meals and 1-2 snacks per day.  Aim for no more than 5 hours between eating.  If you eat breakfast, please do so within one hour of getting up.    Obtain twice as many fruits/vegetables as protein or carbohydrate foods for both lunch and dinner. (Half of each meal should be fruits/vegetables, one quarter protein, and one quarter starchy carbs)   Cut down on sweet beverages. This includes juice, soda, and sweet tea.    Exercise at least 150 minutes every week.    Preventive Care 55-18 Years Old, Male Preventive care refers to lifestyle choices and visits with your health care provider that can promote health and wellness. This includes:  A yearly physical exam. This is also called an annual well check.  Regular dental and eye exams.  Immunizations.  Screening for certain conditions.  Healthy lifestyle choices, such as eating a healthy diet, getting regular exercise, not using drugs or products that contain nicotine and tobacco, and limiting alcohol use. What can I expect for my preventive care visit? Physical exam Your health care provider will check:  Height and weight. These may be used to calculate body mass index (BMI), which is a measurement that tells if you are at a healthy weight.  Heart rate and blood pressure.  Your skin for abnormal spots. Counseling Your health care provider may ask you questions about:  Alcohol, tobacco, and drug use.  Emotional well-being.  Home and relationship well-being.  Sexual activity.  Eating habits.  Work and work  Statistician. What immunizations do I need?  Influenza (flu) vaccine  This is recommended every year. Tetanus, diphtheria, and pertussis (Tdap) vaccine  You may need a Td booster every 10 years. Varicella (chickenpox) vaccine  You may need this vaccine if you have not already been vaccinated. Human papillomavirus (HPV) vaccine  If recommended by your health care provider, you may need three doses over 6 months. Measles, mumps, and rubella (MMR) vaccine  You may need at least one dose of MMR. You may also need a second dose. Meningococcal conjugate (MenACWY) vaccine  One dose is recommended if you are 63-31 years old and a Market researcher living in a residence hall, or if you have one of several medical conditions. You may also need additional booster doses. Pneumococcal conjugate (PCV13) vaccine  You may need this if you have certain conditions and were not previously vaccinated. Pneumococcal polysaccharide (PPSV23) vaccine  You may need one or two doses if you smoke cigarettes or if you have certain conditions. Hepatitis A vaccine  You may need this if you have certain conditions or if you travel or work in places where you may be exposed to hepatitis A. Hepatitis B vaccine  You may need this if you have certain conditions or if you travel or work in places where you may be exposed to hepatitis B. Haemophilus influenzae type b (Hib) vaccine  You may need this if you have certain risk factors. You may receive vaccines as  individual doses or as more than one vaccine together in one shot (combination vaccines). Talk with your health care provider about the risks and benefits of combination vaccines. What tests do I need? Blood tests  Lipid and cholesterol levels. These may be checked every 5 years starting at age 44.  Hepatitis C test.  Hepatitis B test. Screening   Diabetes screening. This is done by checking your blood sugar (glucose) after you have not eaten  for a while (fasting).  Sexually transmitted disease (STD) testing. Talk with your health care provider about your test results, treatment options, and if necessary, the need for more tests. Follow these instructions at home: Eating and drinking   Eat a diet that includes fresh fruits and vegetables, whole grains, lean protein, and low-fat dairy products.  Take vitamin and mineral supplements as recommended by your health care provider.  Do not drink alcohol if your health care provider tells you not to drink.  If you drink alcohol: ? Limit how much you have to 0-2 drinks a day. ? Be aware of how much alcohol is in your drink. In the [place] one drink equals one 12 oz bottle of beer (355 mL), one 5 oz glass of wine (148 mL), or one 1 oz glass of hard liquor (44 mL). Lifestyle  Take daily care of your teeth and gums.  Stay active. Exercise for at least 30 minutes on 5 or more days each week.  Do not use any products that contain nicotine or tobacco, such as cigarettes, e-cigarettes, and chewing tobacco. If you need help quitting, ask your health care provider.  If you are sexually active, practice safe sex. Use a condom or other form of protection to prevent STIs (sexually transmitted infections). What's next?  Go to your health care provider once a year for a well check visit.  Ask your health care provider how often you should have your eyes and teeth checked.  Stay up to date on all vaccines. This information is not intended to replace advice given to you by your health care provider. Make sure you discuss any questions you have with your health care provider. Document Released: 03/29/2001 Document Revised: 01/25/2018 Document Reviewed: 01/25/2018 Elsevier Patient Education  2020 [employer].

## 2018-12-14 NOTE — Progress Notes (Signed)
Chief Complaint:  Jesus Schneider is a 33 y.o. male who presents today for his annual comprehensive physical exam.    Assessment/Plan:  Neck pain No red flags.  Likely muscular strain.  Recommended naproxen twice daily for the next 5 to 7 days.  Discussed exercises and handout was given.  Discussed reasons return to care.  Body mass index is 25.47 kg/m. / Overweight BMI Metric Follow Up - 12/14/18 0812      BMI Metric Follow Up-Please document annually   BMI Metric Follow Up  Education provided       Preventative Healthcare: Tdap given today.  Patient Counseling(The following topics were reviewed and/or handout was given):  -Nutrition: Stressed importance of moderation in sodium/caffeine intake, saturated fat and cholesterol, caloric balance, sufficient intake of fresh fruits, vegetables, and fiber.  -Stressed the importance of regular exercise.   -Substance Abuse: Discussed cessation/primary prevention of tobacco, alcohol, or other drug use; driving or other dangerous activities under the influence; availability of treatment for abuse.   -Injury prevention: Discussed safety belts, safety helmets, smoke detector, smoking near bedding or upholstery.   -Sexuality: Discussed sexually transmitted diseases, partner selection, use of condoms, avoidance of unintended pregnancy and contraceptive alternatives.   -Dental health: Discussed importance of regular tooth brushing, flossing, and dental visits.  -Health maintenance and immunizations reviewed. Please refer to Health maintenance section.  Return to care in 1 year for next preventative visit.     Subjective:  HPI:  He has no acute complaints today.   Neck Pain He has been dealing with mild neck pain for about a week.  Located in his right neck and radiates into his shoulder.  Start ibuprofen and Tylenol which is helped.  Recently did some yard work which he thinks aggravated it.  No weakness.  No numbness.  Lifestyle Diet: No  specific diets or eating plans. Tries to eat plenty of fruits and vegetables.  Exercise: Likes to go running three times per week.   Depression screen PHQ 2/9 10/09/2018  Decreased Interest 0  Down, Depressed, Hopeless 0  PHQ - 2 Score 0    Health Maintenance Due  Topic Date Due  . TETANUS/TDAP  07/20/2014     ROS: Per HPI, otherwise a complete review of systems was negative.   PMH:  The following were reviewed and entered/updated in epic: Past Medical History:  Diagnosis Date  . ADD (attention deficit disorder)   . Cervical herniated disc   . Neuromuscular disorder ([place])    herniated disc   Patient Active Problem List   Diagnosis Date Noted  . Cervical herniated disc 06/20/2017  . History of ADHD 06/20/2017   Past Surgical History:  Procedure Laterality Date  . EPIDURAL BLOCK INJECTION  2016  . WISDOM TOOTH EXTRACTION      Family History  Problem Relation Age of Onset  . Hypertension Mother   . Colon cancer Mother 45  . Colon polyps Mother   . Heart disease Maternal Grandmother   . Heart disease Maternal Grandfather     Medications- reviewed and updated Current Outpatient Medications  Medication Sig Dispense Refill  . Amphetamine Sulfate 10 MG TABS     . Multiple Vitamin (MULTIVITAMIN) capsule Take 1 capsule by mouth daily.     No current facility-administered medications for this visit.     Allergies-reviewed and updated No Known Allergies  Social History   Socioeconomic History  . Marital status: Married    Spouse name: Not on file  .  Number of children: 1  . Years of education: Not on file  . Highest education level: Not on file  Occupational History  . Occupation: Chief Financial Officer  Social Needs  . Financial resource strain: Not on file  . Food insecurity    Worry: Not on file    Inability: Not on file  . Transportation needs    Medical: Not on file    Non-medical: Not on file  Tobacco Use  . Smoking status: Never Smoker  . Smokeless tobacco:  Never Used  Substance and Sexual Activity  . Alcohol use: Yes    Comment: 1-2 drinks daily  . Drug use: Never  . Sexual activity: Yes    Partners: Female    Birth control/protection: Other-see comments    Comment: birth of son 04/16/2017  Lifestyle  . Physical activity    Days per week: Not on file    Minutes per session: Not on file  . Stress: Not on file  Relationships  . Social Herbalist on phone: Not on file    Gets together: Not on file    Attends religious service: Not on file    Active member of club or organization: Not on file    Attends meetings of clubs or organizations: Not on file    Relationship status: Not on file  Other Topics Concern  . Not on file  Social History Narrative  . Not on file        Objective:  Physical Exam: BP 110/62   Pulse 74   Temp 98 F (36.7 C) (Temporal)   Ht 6' (1.829 m)   Wt 187 lb 12.8 oz (85.2 kg)   SpO2 98%   BMI 25.47 kg/m   Body mass index is 25.47 kg/m. Wt Readings from Last 3 Encounters:  12/14/18 187 lb 12.8 oz (85.2 kg)  03/08/18 208 lb 12.8 oz (94.7 kg)  01/26/18 208 lb 4 oz (94.5 kg)   Gen: NAD, resting comfortably HEENT: TMs normal bilaterally. OP clear. No thyromegaly noted.  CV: RRR with no murmurs appreciated Pulm: NWOB, CTAB with no crackles, wheezes, or rhonchi GI: Normal bowel sounds present. Soft, Nontender, Nondistended. MSK: no edema, cyanosis, or clubbing noted -Neck: No deformities.  Mildly tender location along right paraspinal muscles. -Upper extremities: Strength 5 out of 5 throughout.  Neurovascularly intact distally. Skin: warm, dry Neuro: CN2-12 grossly intact. Strength 5/5 in upper and lower extremities. Reflexes symmetric and intact bilaterally.  Psych: Normal affect and thought content     Virgina Deakins M. Jerline Pain, MD 12/14/2018 9:01 AM

## 2019-06-03 ENCOUNTER — Other Ambulatory Visit: Payer: Self-pay

## 2019-06-03 ENCOUNTER — Encounter: Payer: Self-pay | Admitting: Physician Assistant

## 2019-06-03 ENCOUNTER — Ambulatory Visit (INDEPENDENT_AMBULATORY_CARE_PROVIDER_SITE_OTHER): Payer: Commercial Managed Care - PPO | Admitting: Physician Assistant

## 2019-06-03 DIAGNOSIS — D492 Neoplasm of unspecified behavior of bone, soft tissue, and skin: Secondary | ICD-10-CM

## 2019-06-03 DIAGNOSIS — D485 Neoplasm of uncertain behavior of skin: Secondary | ICD-10-CM | POA: Diagnosis not present

## 2019-06-03 DIAGNOSIS — D229 Melanocytic nevi, unspecified: Secondary | ICD-10-CM

## 2019-06-03 DIAGNOSIS — Z1283 Encounter for screening for malignant neoplasm of skin: Secondary | ICD-10-CM | POA: Diagnosis not present

## 2019-06-03 DIAGNOSIS — L301 Dyshidrosis [pompholyx]: Secondary | ICD-10-CM | POA: Diagnosis not present

## 2019-06-03 HISTORY — DX: Melanocytic nevi, unspecified: D22.9

## 2019-06-03 MED ORDER — CLOBETASOL PROPIONATE 0.05 % EX OINT: 1.0000 "application " | TOPICAL_OINTMENT | Freq: Two times a day (BID) | CUTANEOUS | 2 refills | Status: DC

## 2019-06-03 NOTE — Progress Notes (Signed)
New Patient Visit  Subjective  Jesus Schneider is a 34 y.o. male who presents for the following: Skin check (Here to get a baseline skin check.  Has not had anything removed in the past.  Does have a strong history of NMSC on his mother's side; both grandparents and his mother.  Possibly a grandparent with melanoma but he is not positive.  Does have a place on each leg that he would like checked.). He does spend a lot of time in the sun. Wife has noticed a couple moles on the back that she feels are large and dark. He has a spot on each lower leg that seem like scars but sometimes they open up and bleed. He feels like they have been there a very long time. He also has a mole on his right abdomen that seems to change color.   Objective  Well appearing patient in no apparent distress; mood and affect are within normal limits.  A full examination was performed including scalp, head, eyes, ears, nose, lips, neck, chest, axillae, abdomen, back, buttocks, bilateral upper extremities, bilateral lower extremities, hands, feet, fingers, toes, fingernails, and toenails. All findings within normal limits unless otherwise noted below. No suspicious moles noted on back.  Objective  Right 4th Proximal Dorsal Toe: Two small inflamed papules end of 4th toe. Pt. Describes a history of a rash that comes and goes between the toes and in the arch of the foot that itches and gets bumps and blisters and then dries up and peels off. He has never had a medicine for this and feels it has not flared up much in the past year since he has not had to wear shoes and socks to work. He has been working from home.  Objective  Left Lower Leg - Anterior: Scabbed lesion.     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   clobetasol  ointment (TEMOVATE) 0.05 % - Right 4th Proximal Dorsal Toe  Neoplasm of skin (2) Left Lower Leg - Anterior  Skin / nail biopsy Type of biopsy: tangential   Procedure prep:  Patient was prepped and draped in usual sterile fashion (Non sterile) Prep type:  Chlorhexidine Anesthesia: the lesion was anesthetized in a standard fashion   Anesthetic:  1% lidocaine w/ epinephrine 1-100,000 local infiltration Instrument used: flexible razor blade   Hemostasis achieved with: ferric subsulfate   Post-procedure details: sterile dressing applied and wound care instructions given   Dressing type: bandage and petrolatum    Specimen 1 - Surgical pathology Differential Diagnosis: DF vs BCC vs SCC Check Margins: yes  Right abdomen, mid  Skin / nail biopsy Type of biopsy: tangential   Procedure prep:  Patient was prepped and draped in usual sterile fashion (Non sterile) Prep type:  Chlorhexidine Anesthesia: the lesion was anesthetized in a standard fashion   Anesthetic:  1% lidocaine w/ epinephrine 1-100,000 local infiltration Instrument used: flexible razor blade   Hemostasis achieved with: ferric subsulfate   Outcome: patient tolerated procedure well   Post-procedure details: sterile dressing applied and wound care instructions given   Dressing type: bandage and petrolatum    Specimen 2 - Surgical pathology Differential Diagnosis: atypia Check Margins: Yes  Skin exam for malignant neoplasm Mid Back No suspicious moles noted on  back.

## 2019-06-03 NOTE — Patient Instructions (Signed)

## 2019-06-07 ENCOUNTER — Telehealth: Payer: Self-pay

## 2019-06-07 NOTE — Telephone Encounter (Signed)
Path results to patient and 3 month follow-up with JCB made for August 11th at 3:30pm.

## 2019-06-07 NOTE — Telephone Encounter (Signed)
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 Recheck 3 months.

## 2019-09-25 ENCOUNTER — Encounter: Payer: Self-pay | Admitting: Physician Assistant

## 2019-09-25 ENCOUNTER — Other Ambulatory Visit: Payer: Self-pay

## 2019-09-25 ENCOUNTER — Ambulatory Visit (INDEPENDENT_AMBULATORY_CARE_PROVIDER_SITE_OTHER): Payer: Commercial Managed Care - PPO | Admitting: Physician Assistant

## 2019-09-25 DIAGNOSIS — L301 Dyshidrosis [pompholyx]: Secondary | ICD-10-CM

## 2019-09-25 DIAGNOSIS — Z86018 Personal history of other benign neoplasm: Secondary | ICD-10-CM

## 2019-09-25 DIAGNOSIS — Z87898 Personal history of other specified conditions: Secondary | ICD-10-CM

## 2019-09-25 NOTE — Patient Instructions (Signed)
Preventing Skin Cancer, Adult Skin cancer is the most common type of cancer. There are three main types. Squamous cell and basal cell skin cancer are the most common. Melanoma skin cancer is the most dangerous type. Most skin cancers are caused by skin damage from exposure to ultraviolet (UV) light. UV light comes from the sun and from artificial tanning beds. Suntans and sunburns result from exposure to UV light. Skin cancer occurs most often in older people, but it is usually the result of damage done earlier in life. The tans and sunburns you get at any age can lead to skin cancer in the future. To help prevent this, you can take steps to protect yourself. What actions can I take to protect myself from skin cancer? Many people like to get a tan, especially in the summer or when on vacation. However, tan or burned skin is a sign of skin damage. It increases your risk for skin cancer. To lower your risk: Avoid exposure to UV light   Try to stay out of the sun between 10 a.m. and 4 p.m. whenever possible. This is when the sun is at its strongest. Seek the shade during this time.  Remember that you can also be exposed to UV rays on cloudy or hazy days. Sun exposure can be risky year-round, not just in the summer.  Do not use a sunlamp, tanning bed, or tanning booth to get a tan. If you really want a tan, use an artificial tanning lotion.  Avoid getting sunburned. Sunburns are more common on bright sunny days, especially when you are in areas where the sun is reflected off water or snow. Use sunscreen and protective clothing   Always use sunscreen--either a cream, lotion, or spray--when you are out in the sun. Keep sunscreen handy, such as in your gym bag or in your car, so that you will have it when you need it.  Use a sunscreen with a sun protection factor (SPF) of at least 15. Use an SPF of 30 or higher if you are in bright sun, especially when you are out in the snow or on the water.  Make  sure your sunscreen protects you from UVA and UVB light.  Use an adequate amount of sunscreen to cover exposed areas of skin. Put it on 30 minutes before you go out. Reapply it every 2 hours or anytime you come out of the water.  When you are out in the sun, wear a broad-brimmed hat and clothing that covers your arms and legs. Wear wraparound sunglasses. Check your skin for changes  Check your skin often from head to toe to look for any changes in the size, color, or shape of any moles or freckles. Check for any new moles or moles that bleed or become itchy. See your health care provider if you notice changes.  Ask your health care provider about a total skin check. Ask if it should be part of your yearly physical or if you need to see a skin specialist (dermatologist). Take other preventive measures   Avoid exposure to harmful chemicals, such as arsenic. ? Have your home's water tested for arsenic and other chemicals. ? Take protective measures to avoid exposure to chemicals at work.  Do not smoke any tobacco products, such as cigarettes, cigars, pipes, and e-cigarettes. If you need help quitting, ask your health care provider.  Keep your immune system healthy. ? Stay up to date on all vaccines, including the human papillomavirus (HPV) vaccine. ?   Eat at least 5 servings of fruits and vegetables every day. Why are these changes important? About 1 of every 5 people will get skin cancer. The best way to reduce your risk is to avoid skin damage from UV light. If you have teenagers in your house, they should know that just five bad sunburns as a teen could double their risk of skin cancer in the future. If you have younger children, always make sure to protect their skin from the sun. These changes can help reduce your risk of skin cancer, and they will also provide other health benefits, such as the following:  Protecting your skin from the sun can help prevent painful sunburns, sun poisoning,  and other skin damage and blemishes. This is especially important if: ? You have pale white skin, freckles, and red hair. ? You burn easily.  Avoiding exposure to harmful chemicals can help prevent damage to other tissues in your body, such as your lungs, and prevent other types of cancer.  Avoiding smoking tobacco can reduce your risk for other types of cancer and other health problems.  Eating a healthy diet is good for your overall health. What can happen if changes are not made? If you do not make these changes, you will be at higher risk for skin cancer. If you develop skin cancer, the treatments could result in lost time from work and changes in your appearance from scars. The most dangerous type of skin cancer, melanoma, can be deadly if not found early. Where to find support For more support, talk to your primary health care provider or dermatologist. Where to find more information Learn more about skin cancer from:  The Skin Cancer Foundation: www.skincancer.org/prevention  The Centers for Disease Control and Prevention: www.cdc.gov/cancer/skin/  The American Academy of Dermatology: www.aad.org Summary  Skin cancer is the most common type of cancer.  Melanoma skin cancer can be deadly if not found early.  Sunburns and tanning increase your risk for skin cancer.  Protecting your skin from UV light is the best way to prevent skin cancer. This information is not intended to replace advice given to you by your health care provider. Make sure you discuss any questions you have with your health care provider. Document Revised: 05/25/2018 Document Reviewed: 03/27/2017 Elsevier Patient Education  2020 Elsevier Inc.  

## 2019-09-25 NOTE — Progress Notes (Signed)
   Follow up Visit  Subjective  Jesus Schneider is a 34 y.o. male who presents for the following: Skin Problem (follow up biopsy on side and foot). Moderate atypia biopsied abdomen 4/21.  Objective  Well appearing patient in no apparent distress; mood and affect are within normal limits.  A focused examination was performed including abdomen and feet. Relevant physical exam findings are noted in the Assessment and Plan.   Objective  Right Abdomen (side) - Upper: Moderate.Dyspigmented scar.   Objective  Left Foot - Anterior, Right Foot - Anterior: Clear today.  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

## 2019-12-20 ENCOUNTER — Other Ambulatory Visit: Payer: Self-pay

## 2019-12-20 ENCOUNTER — Ambulatory Visit (INDEPENDENT_AMBULATORY_CARE_PROVIDER_SITE_OTHER): Payer: Commercial Managed Care - PPO | Admitting: Family Medicine

## 2019-12-20 ENCOUNTER — Encounter: Payer: Self-pay | Admitting: Family Medicine

## 2019-12-20 VITALS — BP 114/68 | HR 59 | Temp 98.3°F | Ht 72.0 in | Wt 187.6 lb

## 2019-12-20 DIAGNOSIS — E663 Overweight: Secondary | ICD-10-CM

## 2019-12-20 DIAGNOSIS — Z0001 Encounter for general adult medical examination with abnormal findings: Secondary | ICD-10-CM | POA: Diagnosis not present

## 2019-12-20 DIAGNOSIS — Z6829 Body mass index (BMI) 29.0-29.9, adult: Secondary | ICD-10-CM | POA: Diagnosis not present

## 2019-12-20 MED ORDER — AZELASTINE HCL 0.1 % NA SOLN: 2.0000 | Freq: Two times a day (BID) | NASAL | 12 refills | Status: DC

## 2019-12-20 NOTE — Patient Instructions (Signed)
It was very nice to see you today!  Keep up the good work!  Please try the Astelin.  I will see back in year for your next physical.  Please come back to see me sooner if needed.  Take care, Dr Jerline Pain  Please try these tips to maintain a healthy lifestyle:   Eat at least 3 REAL meals and 1-2 snacks per day.  Aim for no more than 5 hours between eating.  If you eat breakfast, please do so within one hour of getting up.    Each meal should contain half fruits/vegetables, one quarter protein, and one quarter carbs (no bigger than a computer mouse)   Cut down on sweet beverages. This includes juice, soda, and sweet tea.     Drink at least 1 glass of water with each meal and aim for at least 8 glasses per day   Exercise at least 150 minutes every week.    Preventive Care 42-49 Years Old, Male Preventive care refers to lifestyle choices and visits with your health care provider that can promote health and wellness. This includes:  A yearly physical exam. This is also called an annual well check.  Regular dental and eye exams.  Immunizations.  Screening for certain conditions.  Healthy lifestyle choices, such as eating a healthy diet, getting regular exercise, not using drugs or products that contain nicotine and tobacco, and limiting alcohol use. What can I expect for my preventive care visit? Physical exam Your health care provider will check:  Height and weight. These may be used to calculate body mass index (BMI), which is a measurement that tells if you are at a healthy weight.  Heart rate and blood pressure.  Your skin for abnormal spots. Counseling Your health care provider may ask you questions about:  Alcohol, tobacco, and drug use.  Emotional well-being.  Home and relationship well-being.  Sexual activity.  Eating habits.  Work and work Statistician. What immunizations do I need?  Influenza (flu) vaccine  This is recommended every year. Tetanus,  diphtheria, and pertussis (Tdap) vaccine  You may need a Td booster every 10 years. Varicella (chickenpox) vaccine  You may need this vaccine if you have not already been vaccinated. Human papillomavirus (HPV) vaccine  If recommended by your health care provider, you may need three doses over 6 months. Measles, mumps, and rubella (MMR) vaccine  You may need at least one dose of MMR. You may also need a second dose. Meningococcal conjugate (MenACWY) vaccine  One dose is recommended if you are 71-81 years old and a Market researcher living in a residence hall, or if you have one of several medical conditions. You may also need additional booster doses. Pneumococcal conjugate (PCV13) vaccine  You may need this if you have certain conditions and were not previously vaccinated. Pneumococcal polysaccharide (PPSV23) vaccine  You may need one or two doses if you smoke cigarettes or if you have certain conditions. Hepatitis A vaccine  You may need this if you have certain conditions or if you travel or work in places where you may be exposed to hepatitis A. Hepatitis B vaccine  You may need this if you have certain conditions or if you travel or work in places where you may be exposed to hepatitis B. Haemophilus influenzae type b (Hib) vaccine  You may need this if you have certain risk factors. You may receive vaccines as individual doses or as more than one vaccine together in one shot (combination  vaccines). Talk with your health care provider about the risks and benefits of combination vaccines. What tests do I need? Blood tests  Lipid and cholesterol levels. These may be checked every 5 years starting at age 20.  Hepatitis C test.  Hepatitis B test. Screening   Diabetes screening. This is done by checking your blood sugar (glucose) after you have not eaten for a while (fasting).  Sexually transmitted disease (STD) testing. Talk with your health care provider about  your test results, treatment options, and if necessary, the need for more tests. Follow these instructions at home: Eating and drinking   Eat a diet that includes fresh fruits and vegetables, whole grains, lean protein, and low-fat dairy products.  Take vitamin and mineral supplements as recommended by your health care provider.  Do not drink alcohol if your health care provider tells you not to drink.  If you drink alcohol: ? Limit how much you have to 0-2 drinks a day. ? Be aware of how much alcohol is in your drink. In the [place] one drink equals one 12 oz bottle of beer (355 mL), one 5 oz glass of wine (148 mL), or one 1 oz glass of hard liquor (44 mL). Lifestyle  Take daily care of your teeth and gums.  Stay active. Exercise for at least 30 minutes on 5 or more days each week.  Do not use any products that contain nicotine or tobacco, such as cigarettes, e-cigarettes, and chewing tobacco. If you need help quitting, ask your health care provider.  If you are sexually active, practice safe sex. Use a condom or other form of protection to prevent STIs (sexually transmitted infections). What's next?  Go to your health care provider once a year for a well check visit.  Ask your health care provider how often you should have your eyes and teeth checked.  Stay up to date on all vaccines. This information is not intended to replace advice given to you by your health care provider. Make sure you discuss any questions you have with your health care provider. Document Revised: 01/25/2018 Document Reviewed: 01/25/2018 Elsevier Patient Education  2020 Elsevier Inc.  

## 2019-12-20 NOTE — Progress Notes (Signed)
Chief Complaint:  Jesus Schneider is a 34 y.o. male who presents today for his annual comprehensive physical exam.    Assessment/Plan:  New/Acute Problems: Rhinorrhea No red flags.  Has been fully vaccinated against Covid and has had Covid in the past as well-doubt that this is Covid infection.  We will start astelin nasal spray.  Discussed reasons to return to care.  Chronic Problems Addressed Today: Body mass index is 25.44 kg/m. / Overweight  BMI Metric Follow Up - 12/20/19 0914      BMI Metric Follow Up-Please document annually   BMI Metric Follow Up Education provided           Preventative Healthcare: Up-to-date on vaccines and screenings.  Patient Counseling(The following topics were reviewed and/or handout was given):  -Nutrition: Stressed importance of moderation in sodium/caffeine intake, saturated fat and cholesterol, caloric balance, sufficient intake of fresh fruits, vegetables, and fiber.  -Stressed the importance of regular exercise.   -Substance Abuse: Discussed cessation/primary prevention of tobacco, alcohol, or other drug use; driving or other dangerous activities under the influence; availability of treatment for abuse.   -Injury prevention: Discussed safety belts, safety helmets, smoke detector, smoking near bedding or upholstery.   -Sexuality: Discussed sexually transmitted diseases, partner selection, use of condoms, avoidance of unintended pregnancy and contraceptive alternatives.   -Dental health: Discussed importance of regular tooth brushing, flossing, and dental visits.  -Health maintenance and immunizations reviewed. Please refer to Health maintenance section.  Return to care in 1 year for next preventative visit.     Subjective:  HPI:  He has no acute complaints today. Has had a runny nose for a few days. No fevers or chills. No known sick contacts.   Lifestyle Diet: Balanced. Getting of plenty of fruits and vegetables.  Exercise: Likes to run  during the week.   Depression screen PHQ 2/9 12/20/2019  Decreased Interest 0  Down, Depressed, Hopeless 0  PHQ - 2 Score 0   Health Maintenance Due  Topic Date Due  . Hepatitis C Screening  Never done     ROS: Per HPI, otherwise a complete review of systems was negative.   PMH:  The following were reviewed and entered/updated in epic: Past Medical History:  Diagnosis Date  . ADD (attention deficit disorder)   . Atypical mole 06/03/2019   moderate atypia on abdomen - recheck in 3 months  . Cervical herniated disc   . Neuromuscular disorder ([place])    herniated disc   Patient Active Problem List   Diagnosis Date Noted  . Cervical herniated disc 06/20/2017  . History of ADHD 06/20/2017   Past Surgical History:  Procedure Laterality Date  . EPIDURAL BLOCK INJECTION  2016  . WISDOM TOOTH EXTRACTION      Family History  Problem Relation Age of Onset  . Hypertension Mother   . Colon cancer Mother 6  . Colon polyps Mother   . Heart disease Maternal Grandmother   . Heart disease Maternal Grandfather     Medications- reviewed and updated Current Outpatient Medications  Medication Sig Dispense Refill  . Amphetamine Sulfate 10 MG TABS     . clobetasol ointment (TEMOVATE) 6.29 % Apply 1 application topically 2 (two) times daily. 60 g 2  . Multiple Vitamin (MULTIVITAMIN) capsule Take 1 capsule by mouth daily.    Marland Kitchen azelastine (ASTELIN) 0.1 % nasal spray Place 2 sprays into both nostrils 2 (two) times daily. 30 mL 12   No current facility-administered medications for this  visit.    Allergies-reviewed and updated No Known Allergies  Social History   Socioeconomic History  . Marital status: Married    Spouse name: Not on file  . Number of children: 1  . Years of education: Not on file  . Highest education level: Not on file  Occupational History  . Occupation: Chief Financial Officer  Tobacco Use  . Smoking status: Never Smoker  . Smokeless tobacco: Never Used  Vaping Use  .  Vaping Use: Never used  Substance and Sexual Activity  . Alcohol use: Yes    Comment: 1-2 drinks daily  . Drug use: Never  . Sexual activity: Yes    Partners: Female    Birth control/protection: Other-see comments    Comment: birth of son 04/16/2017  Other Topics Concern  . Not on file  Social History Narrative  . Not on file   Social Determinants of Health   Financial Resource Strain:   . Difficulty of Paying Living Expenses: Not on file  Food Insecurity:   . Worried About Charity fundraiser in the Last Year: Not on file  . Ran Out of Food in the Last Year: Not on file  Transportation Needs:   . Lack of Transportation (Medical): Not on file  . Lack of Transportation (Non-Medical): Not on file  Physical Activity:   . Days of Exercise per Week: Not on file  . Minutes of Exercise per Session: Not on file  Stress:   . Feeling of Stress : Not on file  Social Connections:   . Frequency of Communication with Friends and Family: Not on file  . Frequency of Social Gatherings with Friends and Family: Not on file  . Attends Religious Services: Not on file  . Active Member of Clubs or Organizations: Not on file  . Attends Archivist Meetings: Not on file  . Marital Status: Not on file        Objective:  Physical Exam: BP 114/68   Pulse (!) 59   Temp 98.3 F (36.8 C) (Temporal)   Ht 6' (1.829 m)   Wt 187 lb 9.6 oz (85.1 kg)   SpO2 97%   BMI 25.44 kg/m   Body mass index is 25.44 kg/m. Wt Readings from Last 3 Encounters:  12/20/19 187 lb 9.6 oz (85.1 kg)  12/14/18 187 lb 12.8 oz (85.2 kg)  03/08/18 208 lb 12.8 oz (94.7 kg)   Gen: NAD, resting comfortably HEENT: TMs normal bilaterally. OP clear. No thyromegaly noted.  CV: RRR with no murmurs appreciated Pulm: NWOB, CTAB with no crackles, wheezes, or rhonchi GI: Normal bowel sounds present. Soft, Nontender, Nondistended. MSK: no edema, cyanosis, or clubbing noted Skin: warm, dry Neuro: CN2-12 grossly intact.  Strength 5/5 in upper and lower extremities. Reflexes symmetric and intact bilaterally.  Psych: Normal affect and thought content     Emmelina Mcloughlin M. Jerline Pain, MD 12/20/2019 9:16 AM

## 2020-02-21 ENCOUNTER — Ambulatory Visit (INDEPENDENT_AMBULATORY_CARE_PROVIDER_SITE_OTHER): Payer: Commercial Managed Care - PPO | Admitting: Internal Medicine

## 2020-02-21 ENCOUNTER — Other Ambulatory Visit: Payer: Self-pay

## 2020-02-21 ENCOUNTER — Encounter: Payer: Self-pay | Admitting: Internal Medicine

## 2020-02-21 ENCOUNTER — Other Ambulatory Visit (HOSPITAL_COMMUNITY)
Admission: RE | Admit: 2020-02-21 | Discharge: 2020-02-21 | Disposition: A | Discharge status: Home / Self Care | Payer: Commercial Managed Care - PPO | Source: Ambulatory Visit | Attending: Internal Medicine | Admitting: Internal Medicine

## 2020-02-21 VITALS — BP 123/82 | HR 57 | Temp 97.7°F | Resp 16 | Ht 72.0 in | Wt 194.2 lb

## 2020-02-21 DIAGNOSIS — R300 Dysuria: Secondary | ICD-10-CM

## 2020-02-21 LAB — POC URINALSYSI DIPSTICK (AUTOMATED)
Bilirubin, UA: NEGATIVE
Blood, UA: NEGATIVE
Glucose, UA: NEGATIVE
Ketones, UA: NEGATIVE
Leukocytes, UA: NEGATIVE
Nitrite, UA: NEGATIVE
Protein, UA: NEGATIVE
Spec Grav, UA: 1.01 (ref 1.010–1.025)
Urobilinogen, UA: 0.2 E.U./dL
pH, UA: 6.5 (ref 5.0–8.0)

## 2020-02-21 NOTE — Progress Notes (Unsigned)
Subjective:    Patient ID: Jesus Schneider, male    DOB: 12/31/1985, 35 y.o.   MRN: [id number]  DOS:  02/21/2020 Type of visit - description: Acute Symptoms a started 3 days ago: Dysuria described as a sharp pain with urination, starting from the very beginning until the end of urination. Denies any penile discharge or genital rash. Denies any high risk sexual behavior.  Review of Systems No fever chills No abdominal pain No gross hematuria, difficulty urinating or urinary frequency.  Past Medical History:  Diagnosis Date  . ADD (attention deficit disorder)   . Atypical mole 06/03/2019   moderate atypia on abdomen - recheck in 3 months  . Cervical herniated disc   . Neuromuscular disorder ([place])    herniated disc    Past Surgical History:  Procedure Laterality Date  . EPIDURAL BLOCK INJECTION  2016  . WISDOM TOOTH EXTRACTION      Social History   Socioeconomic History  . Marital status: Married    Spouse name: Not on file  . Number of children: 2  . Years of education: Not on file  . Highest education level: Not on file  Occupational History  . Occupation: Chief Financial Officer  Tobacco Use  . Smoking status: Never Smoker  . Smokeless tobacco: Never Used  Vaping Use  . Vaping Use: Never used  Substance and Sexual Activity  . Alcohol use: Yes    Comment: 1-2 drinks daily  . Drug use: Never  . Sexual activity: Yes    Partners: Female    Birth control/protection: Other-see comments    Comment: birth of son 04/16/2017  Other Topics Concern  . Not on file  Social History Narrative  . Not on file   Social Determinants of Health   Financial Resource Strain: Not on file  Food Insecurity: Not on file  Transportation Needs: Not on file  Physical Activity: Not on file  Stress: Not on file  Social Connections: Not on file  Intimate Partner Violence: Not on file      Allergies as of 02/21/2020   No Known Allergies     Medication List       Accurate as of February 21, 2020 11:59 PM. If you have any questions, ask your nurse or doctor.        Amphetamine Sulfate 10 MG Tabs   azelastine 0.1 % nasal spray Commonly known as: ASTELIN Place 2 sprays into both nostrils 2 (two) times daily.   clobetasol ointment 0.05 % Commonly known as: TEMOVATE Apply 1 application topically 2 (two) times daily.   multivitamin capsule Take 1 capsule by mouth daily.          Objective:   Physical Exam BP 123/82 (BP Location: Left Arm, Patient Position: Sitting, Cuff Size: Normal)   Pulse (!) 57   Temp 97.7 F (36.5 C) (Oral)   Resp 16   Ht 6' (1.829 m)   Wt 194 lb 4 oz (88.1 kg)   SpO2 100%   BMI 26.35 kg/m  General:   Well developed, NAD, BMI noted. HEENT:  Normocephalic . Face symmetric, atraumatic GU: Normal to inspection, no inguinal lymphadenopathies.  Penis without a rash or discharge.  Testicles normal to palpation. Lower extremities: no pretibial edema bilaterally  Skin: Not pale. Not jaundice Neurologic:  alert & oriented X3.  Speech normal, gait appropriate for age and unassisted Psych--  Cognition and judgment appear intact.  Cooperative with normal attention span and concentration.  Behavior appropriate.  No anxious or depressed appearing.      Assessment    35 year old male, PMH includes ADD, allergies, presents with:  Dysuria: Dysuria for 3 days, no other sxs, denies any high risk sexual behavior.  Exam is normal.  Urinalysis negative. Plan: Check a G&C, swab done.  Send the urine for UA and urine culture.  Further advised with results. Call if symptoms increase, see AVS. As he had a urethral swab the next 2-3 times a day urinary symptoms may be exacerbated.  This visit occurred during the SARS-CoV-2 public health emergency.  Safety protocols were in place, including screening questions prior to the visit, additional usage of staff PPE, and extensive cleaning of exam room while observing appropriate contact time as indicated for  disinfecting solutions.

## 2020-02-21 NOTE — Patient Instructions (Signed)
We are sending the urine and swab to be tested in the lab.  Results may take 2 to 3 days.  Until then drink plenty of fluids, call if you have blood in the urine, fever, chills, any rash.

## 2020-02-21 NOTE — Progress Notes (Signed)
Pre visit review using our clinic review tool, if applicable. No additional management support is needed unless otherwise documented below in the visit note. 

## 2020-02-22 LAB — URINE CULTURE
MICRO NUMBER:: 11394441
Result:: NO GROWTH
SPECIMEN QUALITY:: ADEQUATE

## 2020-02-24 ENCOUNTER — Telehealth: Payer: Self-pay | Admitting: *Deleted

## 2020-02-24 LAB — CYTOLOGY, (ORAL, ANAL, URETHRAL) ANCILLARY ONLY
Chlamydia: NEGATIVE
Comment: NEGATIVE
Comment: NEGATIVE
Comment: NORMAL
Neisseria Gonorrhea: NEGATIVE
Trichomonas: NEGATIVE

## 2020-02-24 NOTE — Telephone Encounter (Signed)
It appears that pt's urine did not get sent for in-house u/a testing at [facility] lab.  POCT u/a was performed and culture was completed via [facility]. Do we need to have pt return for u/a @ [facility] or is POCT u/a and culture sufficient?

## 2020-02-24 NOTE — Telephone Encounter (Signed)
The urine culture should be enough, thank you.

## 2020-02-26 ENCOUNTER — Ambulatory Visit (INDEPENDENT_AMBULATORY_CARE_PROVIDER_SITE_OTHER): Payer: Commercial Managed Care - PPO | Admitting: Internal Medicine

## 2020-02-26 ENCOUNTER — Encounter: Payer: Self-pay | Admitting: Internal Medicine

## 2020-02-26 ENCOUNTER — Other Ambulatory Visit (HOSPITAL_COMMUNITY)
Admission: RE | Admit: 2020-02-26 | Discharge: 2020-02-26 | Disposition: A | Discharge status: Home / Self Care | Payer: Commercial Managed Care - PPO | Source: Ambulatory Visit | Attending: Internal Medicine | Admitting: Internal Medicine

## 2020-02-26 ENCOUNTER — Other Ambulatory Visit: Payer: Self-pay

## 2020-02-26 VITALS — BP 128/82 | HR 62 | Temp 98.0°F | Resp 16 | Ht 72.0 in | Wt 194.0 lb

## 2020-02-26 DIAGNOSIS — Z113 Encounter for screening for infections with a predominantly sexual mode of transmission: Secondary | ICD-10-CM

## 2020-02-26 DIAGNOSIS — N342 Other urethritis: Secondary | ICD-10-CM | POA: Insufficient documentation

## 2020-02-26 MED ORDER — DOXYCYCLINE HYCLATE 100 MG PO TABS: 100.0000 mg | ORAL_TABLET | Freq: Two times a day (BID) | ORAL | 0 refills | Status: DC

## 2020-02-26 MED ORDER — VALACYCLOVIR HCL 1 G PO TABS: 1000.0000 mg | ORAL_TABLET | Freq: Three times a day (TID) | ORAL | 0 refills | Status: DC

## 2020-02-26 NOTE — Patient Instructions (Signed)
Take both doxycycline and Valtrex as prescribed  Sexual abstinence    GO TO THE LAB : Get the blood work     Call next week, let me know how you are doing.

## 2020-02-26 NOTE — Progress Notes (Unsigned)
Pre visit review using our clinic review tool, if applicable. No additional management support is needed unless otherwise documented below in the visit note. 

## 2020-02-26 NOTE — Progress Notes (Unsigned)
Subjective:    Patient ID: Jesus Schneider, male    DOB: Jun 28, 1985, 35 y.o.   MRN: [id number]  DOS:  02/26/2020 Type of visit - description: Acute Recently seen with dysuria, work-up negative, he is here because symptoms continue and they are actually slightly worse.  Dysuria severe throughout the urination time and worse at the tip of the penis. He has some difficulty starting to urinate he thinks is because he is now expecting pain every time.  Testicles are slightly swollen?  Some tenderness to palpation?   Review of Systems Again denies any high risk sexual behavior No fever chills No nausea, vomiting, diarrhea. No abdominal or flank pain No genital rash No penile discharge, no gross hematuria.  Past Medical History:  Diagnosis Date  . ADD (attention deficit disorder)   . Atypical mole 06/03/2019   moderate atypia on abdomen - recheck in 3 months  . Cervical herniated disc   . Neuromuscular disorder ([place])    herniated disc    Past Surgical History:  Procedure Laterality Date  . EPIDURAL BLOCK INJECTION  2016  . WISDOM TOOTH EXTRACTION      Social History   Socioeconomic History  . Marital status: Married    Spouse name: Not on file  . Number of children: 2  . Years of education: Not on file  . Highest education level: Not on file  Occupational History  . Occupation: Chief Financial Officer  Tobacco Use  . Smoking status: Never Smoker  . Smokeless tobacco: Never Used  Vaping Use  . Vaping Use: Never used  Substance and Sexual Activity  . Alcohol use: Yes    Comment: 1-2 drinks daily  . Drug use: Never  . Sexual activity: Yes    Partners: Female    Birth control/protection: Other-see comments    Comment: birth of son 04/16/2017  Other Topics Concern  . Not on file  Social History Narrative  . Not on file   Social Determinants of Health   Financial Resource Strain: Not on file  Food Insecurity: Not on file  Transportation Needs: Not on file  Physical Activity:  Not on file  Stress: Not on file  Social Connections: Not on file  Intimate Partner Violence: Not on file      Allergies as of 02/26/2020   No Known Allergies     Medication List       Accurate as of February 26, 2020  3:50 PM. If you have any questions, ask your nurse or doctor.        Amphetamine Sulfate 10 MG Tabs   azelastine 0.1 % nasal spray Commonly known as: ASTELIN Place 2 sprays into both nostrils 2 (two) times daily.   clobetasol ointment 0.05 % Commonly known as: TEMOVATE Apply 1 application topically 2 (two) times daily.   multivitamin capsule Take 1 capsule by mouth daily.          Objective:   Physical Exam BP 128/82 (BP Location: Left Arm, Patient Position: Sitting, Cuff Size: Normal)   Pulse 62   Temp 98 F (36.7 C) (Oral)   Resp 16   Ht 6' (1.829 m)   Wt 194 lb (88 kg)   SpO2 100%   BMI 26.31 kg/m  General:   Well developed, NAD, BMI noted.  HEENT:  Normocephalic . Face symmetric, atraumatic Abdomen:  Not distended, soft, non-tender. No rebound or rigidity.   GU: He has bilateral slightly tender lymphadenopathies at the inguinal area, there is a  L lymph node that is slightly above 1 cm and slightly tender. No genital rash Penis: No TTP, palpation essentially normal, shaft is no TTP, question of minimal discharge noted. Scrotal contents: Normal DRE: Prostate is normal size, perhaps slightly tender on the right but that is questionable. Skin: Not pale. Not jaundice Lower extremities: no pretibial edema bilaterally  Neurologic:  alert & oriented X3.  Speech normal, gait appropriate for age and unassisted Psych--  Cognition and judgment appear intact.  Cooperative with normal attention span and concentration.  Behavior appropriate. No anxious or depressed appearing.     Assessment     35 year old male, PMH includes ADD, allergies, presents with:  Urethritis: Clinical picture c/w urethritis, symptoms started 8 days ago, recent UA,  G&C negative. This could be a nongonococcal urethritis, herpetic urethritis versus others less common etiologies . Plan: Re-swab for G&C, check a CBC, PSA, BMP, HIV, RPR, HSV, UA urine culture. Empiric doxycycline and Valtrex. Sexual abstinence, recommend wife to be checked by gynecology Call next week if not better: Urology?  This visit occurred during the SARS-CoV-2 public health emergency.  Safety protocols were in place, including screening questions prior to the visit, additional usage of staff PPE, and extensive cleaning of exam room while observing appropriate contact time as indicated for disinfecting solutions.

## 2020-02-27 LAB — BASIC METABOLIC PANEL
BUN: 15 mg/dL (ref 6–23)
CO2: 28 mEq/L (ref 19–32)
Calcium: 10 mg/dL (ref 8.4–10.5)
Chloride: 104 mEq/L (ref 96–112)
Creatinine, Ser: 1.01 mg/dL (ref 0.40–1.50)
GFR: 97.67 mL/min (ref >60.00)
Glucose, Bld: 90 mg/dL (ref 70–99)
Potassium: 4.5 mEq/L (ref 3.5–5.1)
Sodium: 139 mEq/L (ref 135–145)

## 2020-02-27 LAB — CBC WITH DIFFERENTIAL/PLATELET
Basophils Absolute: 0.1 10*3/uL (ref 0.0–0.1)
Basophils Relative: 1 % (ref 0.0–3.0)
Eosinophils Absolute: 0.1 10*3/uL (ref 0.0–0.7)
Eosinophils Relative: 1.9 % (ref 0.0–5.0)
HCT: 45.9 % (ref 39.0–52.0)
Hemoglobin: 15.2 g/dL (ref 13.0–17.0)
Lymphocytes Relative: 30.5 % (ref 12.0–46.0)
Lymphs Abs: 1.9 10*3/uL (ref 0.7–4.0)
MCHC: 33 g/dL (ref 30.0–36.0)
MCV: 87.7 fl (ref 78.0–100.0)
Monocytes Absolute: 0.4 10*3/uL (ref 0.1–1.0)
Monocytes Relative: 7 % (ref 3.0–12.0)
Neutro Abs: 3.7 10*3/uL (ref 1.4–7.7)
Neutrophils Relative %: 59.6 % (ref 43.0–77.0)
Platelets: 166 10*3/uL (ref 150.0–400.0)
RBC: 5.24 Mil/uL (ref 4.22–5.81)
RDW: 14.1 % (ref 11.5–15.5)
WBC: 6.2 10*3/uL (ref 4.0–10.5)

## 2020-02-27 LAB — URINALYSIS, ROUTINE W REFLEX MICROSCOPIC
Bilirubin Urine: NEGATIVE
Hgb urine dipstick: NEGATIVE
Ketones, ur: NEGATIVE
Leukocytes,Ua: NEGATIVE
Nitrite: NEGATIVE
RBC / HPF: NONE SEEN (ref 0–?)
Specific Gravity, Urine: 1.015 (ref 1.000–1.030)
Total Protein, Urine: NEGATIVE
Urine Glucose: NEGATIVE
Urobilinogen, UA: 0.2 (ref 0.0–1.0)
pH: 7 (ref 5.0–8.0)

## 2020-02-27 LAB — PSA: PSA: 1.6 ng/mL (ref 0.10–4.00)

## 2020-02-28 LAB — CYTOLOGY, (ORAL, ANAL, URETHRAL) ANCILLARY ONLY
Chlamydia: NEGATIVE
Comment: NEGATIVE
Comment: NEGATIVE
Comment: NORMAL
Neisseria Gonorrhea: NEGATIVE
Trichomonas: NEGATIVE

## 2020-03-04 ENCOUNTER — Telehealth: Payer: Self-pay | Admitting: Internal Medicine

## 2020-03-04 DIAGNOSIS — N342 Other urethritis: Secondary | ICD-10-CM

## 2020-03-04 LAB — HSV 1 ANTIBODY, IGG: HSV 1 Glycoprotein G Ab, IgG: <0.9 index

## 2020-03-04 LAB — HEPATITIS C ANTIBODY
Hepatitis C Ab: NONREACTIVE
SIGNAL TO CUT-OFF: 0.01 (ref <1.00)

## 2020-03-04 LAB — HSV 2 ANTIBODY, IGG: HSV 2 Glycoprotein G Ab, IgG: <0.9 index

## 2020-03-04 LAB — TEST AUTHORIZATION

## 2020-03-04 LAB — URINE CULTURE
MICRO NUMBER:: 11409933
Result:: NO GROWTH
SPECIMEN QUALITY:: ADEQUATE

## 2020-03-04 LAB — HIV ANTIBODY (ROUTINE TESTING W REFLEX): HIV 1&2 Ab, 4th Generation: NONREACTIVE

## 2020-03-04 LAB — HSV 1/2 AB (IGM), IFA W/RFLX TITER
HSV 1 IgM Screen: NEGATIVE
HSV 2 IgM Screen: NEGATIVE

## 2020-03-04 LAB — RPR: RPR Ser Ql: NONREACTIVE

## 2020-03-04 NOTE — Telephone Encounter (Signed)
Referral placed- I have asked to see if he can be seen within the next 10 days.

## 2020-03-04 NOTE — Telephone Encounter (Signed)
Spoke with the patient, still have some dysuria and paresthesias at the left side. No difficulty urinating, no blood in the urine. HSV IgM pending. Since he continue to be symptomatic we will refer him to urology, please arrange, DX urethritis with persistent symptoms.  Could he be seen within the next 10 days ?

## 2020-06-01 ENCOUNTER — Ambulatory Visit: Payer: Commercial Managed Care - PPO | Admitting: Dermatology

## 2020-06-01 ENCOUNTER — Ambulatory Visit: Payer: Commercial Managed Care - PPO | Admitting: Physician Assistant

## 2020-07-27 ENCOUNTER — Ambulatory Visit (INDEPENDENT_AMBULATORY_CARE_PROVIDER_SITE_OTHER): Payer: Commercial Managed Care - PPO | Admitting: Dermatology

## 2020-07-27 ENCOUNTER — Other Ambulatory Visit: Payer: Self-pay

## 2020-07-27 DIAGNOSIS — L301 Dyshidrosis [pompholyx]: Secondary | ICD-10-CM | POA: Diagnosis not present

## 2020-07-27 DIAGNOSIS — D2372 Other benign neoplasm of skin of left lower limb, including hip: Secondary | ICD-10-CM

## 2020-07-27 DIAGNOSIS — Z808 Family history of malignant neoplasm of other organs or systems: Secondary | ICD-10-CM

## 2020-07-27 DIAGNOSIS — L720 Epidermal cyst: Secondary | ICD-10-CM

## 2020-07-27 DIAGNOSIS — Z1283 Encounter for screening for malignant neoplasm of skin: Secondary | ICD-10-CM | POA: Diagnosis not present

## 2020-07-27 DIAGNOSIS — D239 Other benign neoplasm of skin, unspecified: Secondary | ICD-10-CM

## 2020-07-27 DIAGNOSIS — Z86018 Personal history of other benign neoplasm: Secondary | ICD-10-CM | POA: Diagnosis not present

## 2020-07-27 DIAGNOSIS — Z87898 Personal history of other specified conditions: Secondary | ICD-10-CM

## 2020-07-28 ENCOUNTER — Encounter: Payer: Self-pay | Admitting: Dermatology

## 2020-07-28 NOTE — Progress Notes (Signed)
   Follow-Up Visit   Subjective  Jesus Schneider is a 35 y.o. male who presents for the following: Annual Exam (Here for annual skin exam. Concerns are mole on back. Wants to make sure it has not changed. Also left side abdomen possible cyst. X 1 year. Also check feet he has some eczema that he uses clobetasol on. /Family history of melanoma. ).  General skin examination, possible new cyst on abdomen. Location:  Duration:  Quality:  Associated Signs/Symptoms: Modifying Factors:  Severity:  Timing: Context:   Objective  Well appearing patient in no apparent distress; mood and affect are within normal limits. Left Lower Leg - Anterior Pink scarlike dermal thickenings on each lower leg.  Biopsy of right leg from last year did not show any dermis so it cannot be determined whether this would have supported dermatofibroma.  Left Foot - Anterior, Right Foot - Anterior No active vesicles or active dermatitis, but the history of small blisters that come and go in the absence of any current evidence of tinea on toenails or toe webs with supportive diagnosis of dyshidrotic eczema.  Left Abdomen (side) - Upper Moderate atypical mole on pathology; no sign repigmentation.  Left Abdomen (side) - Upper History of some drainage in the past, currently noninflamed slightly fibrotic palpable 1cm deep dermal to subcutaneous cyst.  Torso - Posterior (Back) General skin examination.  Perhaps twice the average number of nevi but everyone checked with dermoscopy and all virtually identical.    A full examination was performed including scalp, head, eyes, ears, nose, lips, neck, chest, axillae, abdomen, back, buttocks, bilateral upper extremities, bilateral lower extremities, hands, feet, fingers, toes, fingernails, and toenails. All findings within normal limits unless otherwise noted below.   Assessment & Plan    Dermatofibroma Left Lower Leg - Anterior  Leave if stable.  Dyshidrotic  eczema Left Foot - Anterior; Right Foot - Anterior  Patient is using clobetasol cream. Can use moist wrap with the medication.  Emphasized that therapy does not alter the natural history of the eruption and there is strong possibility that this will eventually simply burnout.  Related Medications clobetasol ointment (TEMOVATE) 3.53 % Apply 1 application topically 2 (two) times daily.  History of atypical nevus Left Abdomen (side) - Upper  Recheck as needed change  Epidermal cyst Left Abdomen (side) - Upper  Benign no treatment needed unless patient wants it removed.   Encounter for screening for malignant neoplasm of skin Torso - Posterior (Back)  Twice annual self examination of his skin with spouse.  Continued ultraviolet protection.  Annual skin examination by dermatologist.      I, Lavonna Monarch, MD, have reviewed all documentation for this visit.  The documentation on 07/28/20 for the exam, diagnosis, procedures, and orders are all accurate and complete.

## 2020-09-30 ENCOUNTER — Encounter: Payer: Self-pay | Admitting: Gastroenterology

## 2020-10-22 ENCOUNTER — Other Ambulatory Visit: Payer: Self-pay

## 2020-10-22 ENCOUNTER — Ambulatory Visit (AMBULATORY_SURGERY_CENTER): Payer: Commercial Managed Care - PPO

## 2020-10-22 VITALS — Ht 72.0 in | Wt 190.0 lb

## 2020-10-22 DIAGNOSIS — Z800 Family history of malignant neoplasm of digestive organs: Secondary | ICD-10-CM

## 2020-10-22 DIAGNOSIS — Z86010 Personal history of colonic polyps: Secondary | ICD-10-CM

## 2020-10-22 DIAGNOSIS — Z1211 Encounter for screening for malignant neoplasm of colon: Secondary | ICD-10-CM

## 2020-10-22 MED ORDER — GOLYTELY 236 G PO SOLR: 4000.0000 mL | ORAL | 0 refills | Status: DC

## 2020-10-22 NOTE — Progress Notes (Signed)
Pre visit completed via phone call; Patient verified name, DOB, and address; No egg or soy allergy known to patient  No issues with past sedation with any surgeries or procedures Patient denies ever being told they had issues or difficulty with intubation  No FH of Malignant Hyperthermia No diet pills per patient No home 02 use per patient  No blood thinners per patient  Pt denies issues with constipation at this time; No A fib or A flutter  advised to increase fluids/increase exercise/fruits/veggies; EMMI video via [place] 19 guidelines implemented in PV today with Pt and RN   Pt is fully vaccinated for Covid x 2;  NO PA's for preps discussed with pt in PV today  Discussed with pt there will be an out-of-pocket cost for prep and that varies from $0 to 70 +  dollars   Due to the COVID-19 pandemic we are asking patients to follow certain guidelines.  Pt aware of COVID protocols and [facility] guidelines

## 2020-10-28 ENCOUNTER — Encounter: Payer: Self-pay | Admitting: Gastroenterology

## 2020-11-06 ENCOUNTER — Encounter: Payer: Self-pay | Admitting: Gastroenterology

## 2020-11-06 ENCOUNTER — Other Ambulatory Visit: Payer: Self-pay

## 2020-11-06 ENCOUNTER — Ambulatory Visit (AMBULATORY_SURGERY_CENTER): Payer: Commercial Managed Care - PPO | Admitting: Gastroenterology

## 2020-11-06 VITALS — BP 110/75 | HR 57 | Temp 98.6°F | Resp 11 | Ht 72.0 in | Wt 190.0 lb

## 2020-11-06 DIAGNOSIS — Z800 Family history of malignant neoplasm of digestive organs: Secondary | ICD-10-CM

## 2020-11-06 DIAGNOSIS — D122 Benign neoplasm of ascending colon: Secondary | ICD-10-CM

## 2020-11-06 DIAGNOSIS — K635 Polyp of colon: Secondary | ICD-10-CM

## 2020-11-06 DIAGNOSIS — Z86010 Personal history of colonic polyps: Secondary | ICD-10-CM

## 2020-11-06 DIAGNOSIS — Z1211 Encounter for screening for malignant neoplasm of colon: Secondary | ICD-10-CM

## 2020-11-06 MED ORDER — SODIUM CHLORIDE 0.9 % IV SOLN
500.0000 mL | Freq: Once | INTRAVENOUS | Status: DC
Start: 2020-11-06 — End: 2020-12-25

## 2020-11-06 NOTE — Op Note (Signed)
[facility] Patient Name: Jesus Schneider Procedure Date: 11/06/2020 7:28 AM MRN: [id number] Endoscopist: Milus Banister , MD Age: 35 Referring MD:  Date of Birth: 06-06-1985 Gender: Male Account #: [id number] Procedure:                Colonoscopy Indications:              High risk colon cancer surveillance: Personal                            history of colonic polyps; Mother with CRC in her                            45s; Colonsocopy 2019 three subCM precancerous                            polyps Medicines:                Monitored Anesthesia Care Procedure:                Pre-Anesthesia Assessment:                           - Prior to the procedure, a History and Physical                            was performed, and patient medications and                            allergies were reviewed. The patient's tolerance of                            previous anesthesia was also reviewed. The risks                            and benefits of the procedure and the sedation                            options and risks were discussed with the patient.                            All questions were answered, and informed consent                            was obtained. Prior Anticoagulants: The patient has                            taken no previous anticoagulant or antiplatelet                            agents. ASA Grade Assessment: II - A patient with                            mild systemic disease. After reviewing the risks  and benefits, the patient was deemed in                            satisfactory condition to undergo the procedure.                           After obtaining informed consent, the colonoscope                            was passed under direct vision. Throughout the                            procedure, the patient's blood pressure, pulse, and                            oxygen saturations were monitored continuously. The                             CF [id number] #[id number] was introduced through the anus                            and advanced to the the cecum, identified by                            appendiceal orifice and ileocecal valve. The                            colonoscopy was performed without difficulty. The                            patient tolerated the procedure well. The quality                            of the bowel preparation was good. The ileocecal                            valve, appendiceal orifice, and rectum were                            photographed. Scope In: 7:50:29 AM Scope Out: 8:09:06 AM Scope Withdrawal Time: 0 hours 15 minutes 49 seconds  Total Procedure Duration: 0 hours 18 minutes 37 seconds  Findings:                 A 3 mm polyp was found in the ascending colon. The                            polyp was sessile. The polyp was removed with a                            cold snare. Resection and retrieval were complete.                           The exam was otherwise without abnormality on  direct and retroflexion views. Complications:            No immediate complications. Estimated blood loss:                            None. Estimated Blood Loss:     Estimated blood loss: none. Impression:               - One 3 mm polyp in the ascending colon, removed                            with a cold snare. Resected and retrieved.                           - The examination was otherwise normal on direct                            and retroflexion views. Recommendation:           - Patient has a contact number available for                            emergencies. The signs and symptoms of potential                            delayed complications were discussed with the                            patient. Return to normal activities tomorrow.                            Written discharge instructions were provided to the                            patient.                            - Resume previous diet.                           - Continue present medications.                           - Await pathology results. Milus Banister, MD 11/06/2020 8:11:14 AM This report has been signed electronically.

## 2020-11-06 NOTE — Progress Notes (Signed)
Called to room to assist during endoscopic procedure.  Patient ID and intended procedure confirmed with present staff. Received instructions for my participation in the procedure from the performing physician.  

## 2020-11-06 NOTE — Progress Notes (Signed)
VS by CW  Pt's states no medical or surgical changes since previsit or office visit.  

## 2020-11-06 NOTE — Progress Notes (Signed)
To PACU, VSS. Report to [email or web address] 

## 2020-11-06 NOTE — Progress Notes (Signed)
HPI: This is a man with h/o polpys and FH of colon cancer   ROS: complete GI ROS as described in HPI, all other review negative.  Constitutional:  No unintentional weight loss   Past Medical History:  Diagnosis Date   ADD (attention deficit disorder)    Atypical mole 06/03/2019   moderate atypia on abdomen - recheck in 3 months   Cervical herniated disc    Neuromuscular disorder ([facility])    herniated disc    Past Surgical History:  Procedure Laterality Date   COLONOSCOPY  2019   DJ-MAC-goly (pre)-TA/ [facility]   EPIDURAL BLOCK INJECTION  2016   POLYPECTOMY  2019   TA/[facility]   WISDOM TOOTH EXTRACTION      Current Outpatient Medications  Medication Sig Dispense Refill   Amphetamine Sulfate 10 MG TABS Take 1 tablet by mouth in the morning and at bedtime.     Multiple Vitamin (MULTIVITAMIN) capsule Take 1 capsule by mouth daily.     azelastine (ASTELIN) 0.1 % nasal spray Place 2 sprays into both nostrils 2 (two) times daily. (Patient taking differently: Place 2 sprays into both nostrils 2 (two) times daily as needed.) 30 mL 12   clobetasol ointment (TEMOVATE) 5.46 % Apply 1 application topically 2 (two) times daily. (Patient taking differently: Apply 1 application topically 2 (two) times daily as needed.) 60 g 2   Current Facility-Administered Medications  Medication Dose Route Frequency Provider Last Rate Last Admin   0.9 %  sodium chloride infusion  500 mL Intravenous Once Milus Banister, MD        Allergies as of 11/06/2020   (No Known Allergies)    Family History  Problem Relation Age of Onset   Hypertension Mother    Colon cancer Mother 57   Colon polyps Mother 51   Colon polyps Maternal Uncle    Colon cancer Maternal Uncle    Heart disease Maternal Grandmother    Heart disease Maternal Grandfather    Esophageal cancer Neg Hx    Rectal cancer Neg Hx    Stomach cancer Neg Hx     Social History   Socioeconomic History   Marital status: Married    Spouse name: Not  on file   Number of children: 2   Years of education: Not on file   Highest education level: Not on file  Occupational History   Occupation: Chief Financial Officer  Tobacco Use   Smoking status: Never   Smokeless tobacco: Never  Vaping Use   Vaping Use: Never used  Substance and Sexual Activity   Alcohol use: Yes    Alcohol/week: 5.0 - 7.0 standard drinks    Types: 5 - 7 Standard drinks or equivalent per week   Drug use: Never   Sexual activity: Yes    Partners: Female    Birth control/protection: Other-see comments    Comment: birth of son 04/16/2017  Other Topics Concern   Not on file  Social History Narrative   Not on file   Social Determinants of Health   Financial Resource Strain: Not on file  Food Insecurity: Not on file  Transportation Needs: Not on file  Physical Activity: Not on file  Stress: Not on file  Social Connections: Not on file  Intimate Partner Violence: Not on file     Physical Exam: BP 116/85   Pulse 62   Temp 98.6 F (37 C)   Ht 6' (1.829 m)   Wt 190 lb (86.2 kg)   SpO2 100%  BMI 25.77 kg/m  Constitutional: generally well-appearing Psychiatric: alert and oriented x3 Lungs: CTA bilaterally Heart: no MCR  Assessment and plan: 35 y.o. male with h/o polyps and fh of colon cancer  Colonoscopy today  Care is appropriate for the ambulatory setting.  Owens Loffler, MD [facility] 11/06/2020, 7:40 AM

## 2020-11-06 NOTE — Patient Instructions (Signed)
Information on polyps given to you today.  Await pathology results.  Resume previous diet and medications.  YOU HAD AN ENDOSCOPIC PROCEDURE TODAY AT THE [facility]:   Refer to the procedure report that was given to you for any specific questions about what was found during the examination.  If the procedure report does not answer your questions, please call your gastroenterologist to clarify.  If you requested that your care partner not be given the details of your procedure findings, then the procedure report has been included in a sealed envelope for you to review at your convenience later.  YOU SHOULD EXPECT: Some feelings of bloating in the abdomen. Passage of more gas than usual.  Walking can help get rid of the air that was put into your GI tract during the procedure and reduce the bloating. If you had a lower endoscopy (such as a colonoscopy or flexible sigmoidoscopy) you may notice spotting of blood in your stool or on the toilet paper. If you underwent a bowel prep for your procedure, you may not have a normal bowel movement for a few days.  Please Note:  You might notice some irritation and congestion in your nose or some drainage.  This is from the oxygen used during your procedure.  There is no need for concern and it should clear up in a day or so.  SYMPTOMS TO REPORT IMMEDIATELY:   Following lower endoscopy (colonoscopy or flexible sigmoidoscopy):  Excessive amounts of blood in the stool  Significant tenderness or worsening of abdominal pains  Swelling of the abdomen that is new, acute  Fever of 100F or higher   For urgent or emergent issues, a gastroenterologist can be reached at any hour by calling [phone]. Do not use MyChart messaging for urgent concerns.    DIET:  We do recommend a small meal at first, but then you may proceed to your regular diet.  Drink plenty of fluids but you should avoid alcoholic beverages for 24 hours.  ACTIVITY:  You should  plan to take it easy for the rest of today and you should NOT DRIVE or use heavy machinery until tomorrow (because of the sedation medicines used during the test).    FOLLOW UP: Our staff will call the number listed on your records 48-72 hours following your procedure to check on you and address any questions or concerns that you may have regarding the information given to you following your procedure. If we do not reach you, we will leave a message.  We will attempt to reach you two times.  During this call, we will ask if you have developed any symptoms of COVID 19. If you develop any symptoms (ie: fever, flu-like symptoms, shortness of breath, cough etc.) before then, please call [phone].  If you test positive for Covid 19 in the 2 weeks post procedure, please call and report this information to us.    If any biopsies were taken you will be contacted by phone or by letter within the next 1-3 weeks.  Please call us at [phone] if you have not heard about the biopsies in 3 weeks.    SIGNATURES/CONFIDENTIALITY: You and/or your care partner have signed paperwork which will be entered into your electronic medical record.  These signatures attest to the fact that that the information above on your After Visit Summary has been reviewed and is understood.  Full responsibility of the confidentiality of this discharge information lies with you and/or your care-partner. 

## 2020-11-10 ENCOUNTER — Telehealth: Payer: Self-pay

## 2020-11-10 NOTE — Telephone Encounter (Signed)
  Follow up Call-  Call back number 11/06/2020  Post procedure Call Back phone  # [phone]  Permission to leave phone message Yes  Some recent data might be hidden     Patient questions:  Do you have a fever, pain , or abdominal swelling? No. Pain Score  0 *  Have you tolerated food without any problems? Yes.    Have you been able to return to your normal activities? Yes.    Do you have any questions about your discharge instructions: Diet   No. Medications  No. Follow up visit  No.  Do you have questions or concerns about your Care? No.  Actions: * If pain score is 4 or above: No action needed, pain <4.

## 2020-11-16 ENCOUNTER — Encounter: Payer: Self-pay | Admitting: Gastroenterology

## 2020-11-24 ENCOUNTER — Encounter: Payer: Self-pay | Admitting: Gastroenterology

## 2020-12-25 ENCOUNTER — Other Ambulatory Visit: Payer: Self-pay

## 2020-12-25 ENCOUNTER — Encounter: Payer: Self-pay | Admitting: Family Medicine

## 2020-12-25 ENCOUNTER — Ambulatory Visit (INDEPENDENT_AMBULATORY_CARE_PROVIDER_SITE_OTHER): Payer: Commercial Managed Care - PPO | Admitting: Family Medicine

## 2020-12-25 VITALS — BP 103/67 | HR 66 | Temp 98.2°F | Ht 72.0 in | Wt 201.2 lb

## 2020-12-25 DIAGNOSIS — E663 Overweight: Secondary | ICD-10-CM

## 2020-12-25 DIAGNOSIS — Z0001 Encounter for general adult medical examination with abnormal findings: Secondary | ICD-10-CM | POA: Diagnosis not present

## 2020-12-25 DIAGNOSIS — Z6827 Body mass index (BMI) 27.0-27.9, adult: Secondary | ICD-10-CM

## 2020-12-25 NOTE — Progress Notes (Signed)
Chief Complaint:  Jesus Schneider is a 35 y.o. male who presents today for his annual comprehensive physical exam.    Assessment/Plan:  Healthy 35 year old man doing well.   Chronic Problems Addressed Today: Body mass index is 27.29 kg/m. / Overweight  BMI Metric Follow Up - 12/25/20 0902       BMI Metric Follow Up-Please document annually   BMI Metric Follow Up Education provided             Preventative Healthcare: Gets labs done at work - he will drop off a copy next time he has them done.   Patient Counseling(The following topics were reviewed and/or handout was given):  -Nutrition: Stressed importance of moderation in sodium/caffeine intake, saturated fat and cholesterol, caloric balance, sufficient intake of fresh fruits, vegetables, and fiber.  -Stressed the importance of regular exercise.   -Substance Abuse: Discussed cessation/primary prevention of tobacco, alcohol, or other drug use; driving or other dangerous activities under the influence; availability of treatment for abuse.   -Injury prevention: Discussed safety belts, safety helmets, smoke detector, smoking near bedding or upholstery.   -Sexuality: Discussed sexually transmitted diseases, partner selection, use of condoms, avoidance of unintended pregnancy and contraceptive alternatives.   -Dental health: Discussed importance of regular tooth brushing, flossing, and dental visits.  -Health maintenance and immunizations reviewed. Please refer to Health maintenance section.  Return to care in 1 year for next preventative visit.     Subjective:  HPI:  He has no acute complaints today.   He notes that his weight has increased significantly during the past year due to family stresses. In relation to this, he states that he has a FMHx of Alzheimer's, where his mother is in the early stages, and his grandmother passed due to Alzheimer's.  He is compliant with all medication with no notable side  effects.  Lifestyle Diet: Reasonably healthy diet, but can reduce carb intake, increase fruit and vegetable intake.  Exercise: Has reduced over time in the past year due to personal/family life stressors.  Depression screen PHQ 2/9 12/25/2020  Decreased Interest 0  Down, Depressed, Hopeless 0  PHQ - 2 Score 0    Health Maintenance Due  Topic Date Due   COVID-19 Vaccine (4 - Booster for Pfizer series) 03/26/2020     ROS: Per HPI, otherwise a complete review of systems was negative.   PMH:  The following were reviewed and entered/updated in epic: Past Medical History:  Diagnosis Date   ADD (attention deficit disorder)    Atypical mole 06/03/2019   moderate atypia on abdomen - recheck in 3 months   Cervical herniated disc    Neuromuscular disorder (HCC)    herniated disc   Patient Active Problem List   Diagnosis Date Noted   Cervical herniated disc 06/20/2017   History of ADHD 06/20/2017   Past Surgical History:  Procedure Laterality Date   COLONOSCOPY  2019   DJ-MAC-goly (pre)-TA/ SSP   EPIDURAL BLOCK INJECTION  2016   POLYPECTOMY  2019   TA/SSP   WISDOM TOOTH EXTRACTION      Family History  Problem Relation Age of Onset   Hypertension Mother    Colon cancer Mother 97   Colon polyps Mother 54   Colon polyps Maternal Uncle    Colon cancer Maternal Uncle    Heart disease Maternal Grandmother    Heart disease Maternal Grandfather    Esophageal cancer Neg Hx    Rectal cancer Neg Hx  Stomach cancer Neg Hx     Medications- reviewed and updated Current Outpatient Medications  Medication Sig Dispense Refill   Amphetamine Sulfate 10 MG TABS Take 1 tablet by mouth in the morning and at bedtime.     azelastine (ASTELIN) 0.1 % nasal spray Place 2 sprays into both nostrils 2 (two) times daily. (Patient taking differently: Place 2 sprays into both nostrils 2 (two) times daily as needed.) 30 mL 12   clobetasol ointment (TEMOVATE) 4.66 % Apply 1 application topically  2 (two) times daily. (Patient taking differently: Apply 1 application topically 2 (two) times daily as needed.) 60 g 2   Multiple Vitamin (MULTIVITAMIN) capsule Take 1 capsule by mouth daily.     No current facility-administered medications for this visit.    Allergies-reviewed and updated No Known Allergies  Social History   Socioeconomic History   Marital status: Married    Spouse name: Not on file   Number of children: 2   Years of education: Not on file   Highest education level: Not on file  Occupational History   Occupation: Chief Financial Officer  Tobacco Use   Smoking status: Never   Smokeless tobacco: Never  Vaping Use   Vaping Use: Never used  Substance and Sexual Activity   Alcohol use: Yes    Alcohol/week: 5.0 - 7.0 standard drinks    Types: 5 - 7 Standard drinks or equivalent per week   Drug use: Never   Sexual activity: Yes    Partners: Female    Birth control/protection: Other-see comments    Comment: birth of son 04/16/2017  Other Topics Concern   Not on file  Social History Narrative   Not on file   Social Determinants of Health   Financial Resource Strain: Not on file  Food Insecurity: Not on file  Transportation Needs: Not on file  Physical Activity: Not on file  Stress: Not on file  Social Connections: Not on file        Objective:  Physical Exam: BP 103/67   Pulse 66   Temp 98.2 F (36.8 C) (Temporal)   Ht 6' (1.829 m)   Wt 201 lb 3.2 oz (91.3 kg)   SpO2 98%   BMI 27.29 kg/m   Body mass index is 27.29 kg/m. Wt Readings from Last 3 Encounters:  12/25/20 201 lb 3.2 oz (91.3 kg)  11/06/20 190 lb (86.2 kg)  10/22/20 190 lb (86.2 kg)   Gen: NAD, resting comfortably HEENT: TMs normal bilaterally. OP clear. No thyromegaly noted.  CV: RRR with no murmurs appreciated Pulm: NWOB, CTAB with no crackles, wheezes, or rhonchi GI: Normal bowel sounds present. Soft, Nontender, Nondistended. MSK: no edema, cyanosis, or clubbing noted Skin: warm,  dry Neuro: CN2-12 grossly intact. Strength 5/5 in upper and lower extremities. Reflexes symmetric and intact bilaterally.  Psych: Normal affect and thought content     I,Jordan Kelly,acting as a scribe for Dimas Chyle, MD.,have documented all relevant documentation on the behalf of Dimas Chyle, MD,as directed by  Dimas Chyle, MD while in the presence of Dimas Chyle, MD.  I, Dimas Chyle, MD, have reviewed all documentation for this visit. The documentation on 12/25/20 for the exam, diagnosis, procedures, and orders are all accurate and complete.  [name]. Jerline Pain, MD 12/25/2020 9:04 AM

## 2020-12-25 NOTE — Patient Instructions (Signed)
It was very nice to see you today!  Keep working on diet and exercise.  We will see you back next year for your next physical.  Come back sooner if needed.  Take care, Dr Jerline Pain  PLEASE NOTE:  If you had any lab tests please let us know if you have not heard back within a few days. You may see your results on mychart before we have a chance to review them but we will give you a call once they are reviewed by [place]. If we ordered any referrals today, please let us know if you have not heard from their office within the next week.   Please try these tips to maintain a healthy lifestyle:  Eat at least 3 REAL meals and 1-2 snacks per day.  Aim for no more than 5 hours between eating.  If you eat breakfast, please do so within one hour of getting up.   Each meal should contain half fruits/vegetables, one quarter protein, and one quarter carbs (no bigger than a computer mouse)  Cut down on sweet beverages. This includes juice, soda, and sweet tea.   Drink at least 1 glass of water with each meal and aim for at least 8 glasses per day  Exercise at least 150 minutes every week.    PRPreventive Care 68-81 Years Old, Male Preventive care refers to lifestyle choices and visits with your health care provider that can promote health and wellness. Preventive care visits are also called wellness exams. What can I expect for my preventive care visit? Counseling During your preventive care visit, your health care provider may ask about your: Medical history, including: Past medical problems. Family medical history. Current health, including: Emotional well-being. Home life and relationship well-being. Sexual activity. Lifestyle, including: Alcohol, nicotine or tobacco, and drug use. Access to firearms. Diet, exercise, and sleep habits. Safety issues such as seatbelt and bike helmet use. Sunscreen use. Work and work Statistician. Physical exam Your health care provider may check your: Height  and weight. These may be used to calculate your BMI (body mass index). BMI is a measurement that tells if you are at a healthy weight. Waist circumference. This measures the distance around your waistline. This measurement also tells if you are at a healthy weight and may help predict your risk of certain diseases, such as type 2 diabetes and high blood pressure. Heart rate and blood pressure. Body temperature. Skin for abnormal spots. What immunizations do I need? Vaccines are usually given at various ages, according to a schedule. Your health care provider will recommend vaccines for you based on your age, medical history, and lifestyle or other factors, such as travel or where you work. What tests do I need? Screening Your health care provider may recommend screening tests for certain conditions. This may include: Lipid and cholesterol levels. Diabetes screening. This is done by checking your blood sugar (glucose) after you have not eaten for a while (fasting). Hepatitis B test. Hepatitis C test. HIV (human immunodeficiency virus) test. STI (sexually transmitted infection) testing, if you are at risk. Talk with your health care provider about your test results, treatment options, and if necessary, the need for more tests. Follow these instructions at home: Eating and drinking  Eat a healthy diet that includes fresh fruits and vegetables, whole grains, lean protein, and low-fat dairy products. Drink enough fluid to keep your urine pale yellow. Take vitamin and mineral supplements as recommended by your health care provider. Do not drink alcohol  if your health care provider tells you not to drink. If you drink alcohol: Limit how much you have to 0-2 drinks a day. Know how much alcohol is in your drink. In the [place] one drink equals one 12 oz bottle of beer (355 mL), one 5 oz glass of wine (148 mL), or one 1 oz glass of hard liquor (44 mL). Lifestyle Brush your teeth every morning and  night with fluoride toothpaste. Floss one time each day. Exercise for at least 30 minutes 5 or more days each week. Do not use any products that contain nicotine or tobacco. These products include cigarettes, chewing tobacco, and vaping devices, such as e-cigarettes. If you need help quitting, ask your health care provider. Do not use drugs. If you are sexually active, practice safe sex. Use a condom or other form of protection to prevent STIs. Find healthy ways to manage stress, such as: Meditation, yoga, or listening to music. Journaling. Talking to a trusted person. Spending time with friends and family. Minimize exposure to UV radiation to reduce your risk of skin cancer. Safety Always wear your seat belt while driving or riding in a vehicle. Do not drive: If you have been drinking alcohol. Do not ride with someone who has been drinking. If you have been using any mind-altering substances or drugs. While texting. When you are tired or distracted. Wear a helmet and other protective equipment during sports activities. If you have firearms in your house, make sure you follow all gun safety procedures. Seek help if you have been physically or sexually abused. What's next? Go to your health care provider once a year for an annual wellness visit. Ask your health care provider how often you should have your eyes and teeth checked. Stay up to date on all vaccines. This information is not intended to replace advice given to you by your health care provider. Make sure you discuss any questions you have with your health care provider. Document Revised: 07/29/2020 Document Reviewed: 07/29/2020 Elsevier Patient Education  [employer].

## 2021-02-17 ENCOUNTER — Encounter: Payer: Self-pay | Admitting: Family

## 2021-02-17 ENCOUNTER — Telehealth (INDEPENDENT_AMBULATORY_CARE_PROVIDER_SITE_OTHER): Payer: Commercial Managed Care - PPO | Admitting: Family

## 2021-02-17 VITALS — Temp 101.0°F | Ht 72.0 in | Wt 201.3 lb

## 2021-02-17 DIAGNOSIS — U071 COVID-19: Secondary | ICD-10-CM | POA: Diagnosis not present

## 2021-02-17 MED ORDER — NIRMATRELVIR/RITONAVIR (PAXLOVID)TABLET: 3.0000 | ORAL_TABLET | Freq: Two times a day (BID) | ORAL | 0 refills | Status: AC

## 2021-02-17 NOTE — Progress Notes (Signed)
MyChart Video Visit    Virtual Visit via Video Note   This visit type was conducted due to national recommendations for restrictions regarding the COVID-19 Pandemic (e.g. social distancing) in an effort to limit this patient's exposure and mitigate transmission in our community. This patient is at least at moderate risk for complications without adequate follow up. This format is felt to be most appropriate for this patient at this time. Physical exam was limited by quality of the video and audio technology used for the visit. CMA was able to get the patient set up on a video visit.  Patient location: Home. Patient and provider in visit Provider location: Office  I discussed the limitations of evaluation and management by telemedicine and the availability of in person appointments. The patient expressed understanding and agreed to proceed.  Visit Date: 02/17/2021  Today's healthcare provider: Jeanie Sewer, NP     Subjective:    Patient ID: Jesus Schneider, male    DOB: 12/12/1985, 36 y.o.   MRN: [id number]  Chief Complaint  Patient presents with   Covid Positive    Tested 02/16/2021. Symptoms started Monday. He has taken OTV Advil.    Fever    101 today, 102.7 yesterday.   Generalized Body Aches   Nasal Congestion   Chills   Headache   Cough    Mild    HPI Upper Respiratory Infection: Symptoms include achiness, congestion, headache described as all over, nasal congestion, and chills , cough. Onset of symptoms was 1 day ago, gradually worsening since that time. He is drinking moderate amounts of fluids. Evaluation to date: none.  Treatment to date: none.    Past Medical History:  Diagnosis Date   ADD (attention deficit disorder)    Atypical mole 06/03/2019   moderate atypia on abdomen - recheck in 3 months   Cervical herniated disc    Neuromuscular disorder ([place])    herniated disc    Past Surgical History:  Procedure Laterality Date   COLONOSCOPY  2019    DJ-MAC-goly (pre)-TA/ SSP   EPIDURAL BLOCK INJECTION  2016   POLYPECTOMY  2019   TA/SSP    Outpatient Medications Prior to Visit  Medication Sig Dispense Refill   Amphetamine Sulfate 10 MG TABS Take 1 tablet by mouth in the morning and at bedtime.     Multiple Vitamin (MULTIVITAMIN) capsule Take 1 capsule by mouth daily.     azelastine (ASTELIN) 0.1 % nasal spray Place 2 sprays into both nostrils 2 (two) times daily. (Patient not taking: Reported on 02/17/2021) 30 mL 12   clobetasol ointment (TEMOVATE) 0.03 % Apply 1 application topically 2 (two) times daily. (Patient not taking: Reported on 02/17/2021) 60 g 2   No facility-administered medications prior to visit.    No Known Allergies      Objective:     Physical Exam Vitals and nursing note reviewed.  Constitutional:      General: She is not in acute distress.    Appearance: Normal appearance.  HENT:     Head: Normocephalic.  Pulmonary:     Effort: No respiratory distress.  Musculoskeletal:     Cervical back: Normal range of motion.  Skin:    General: Skin is dry.     Coloration: Skin is not pale.  Neurological:     Mental Status: She is alert and oriented to person, place, and time.  Psychiatric:        Mood and Affect: Mood normal.  Temp (!) 101 F (38.3 C) Comment: Pt reported   Ht 6' (1.829 m)    Wt 201 lb 4.5 oz (91.3 kg)    BMI 27.30 kg/m   Wt Readings from Last 3 Encounters:  02/17/21 201 lb 4.5 oz (91.3 kg)  12/25/20 201 lb 3.2 oz (91.3 kg)  11/06/20 190 lb (86.2 kg)       Assessment & Plan:   Problem List Items Addressed This Visit       Other   COVID-19 - Primary    Sending Paxlovid, pt advised of FDA label for emergency use, how to take, & SE. Advised of CDC guidelines for self isolation/ ending isolation.  Advised of safe practice guidelines. Symptom Tier reviewed.  Encouraged to monitor for any worsening symptoms; watch for increased shortness of breath, weakness, and signs of dehydration.  Instructed to rest and hydrate well.  Advised to wear a mask if necessary to leave the house.        Relevant Medications   nirmatrelvir/ritonavir EUA (PAXLOVID) 20 x 150 MG & 10 x 100MG TABS       I discussed the assessment and treatment plan with the patient. The patient was provided an opportunity to ask questions and all were answered. The patient agreed with the plan and demonstrated an understanding of the instructions.   The patient was advised to call back or seek an in-person evaluation if the symptoms worsen or if the condition fails to improve as anticipated.  I provided 21 minutes of face-to-face time during this encounter.   Jeanie Sewer, NP [place] [phone] (phone) [phone] (fax)  [place]

## 2021-02-17 NOTE — Assessment & Plan Note (Signed)
Sending Paxlovid, pt advised of FDA label for emergency use, how to take, & SE. Advised of CDC guidelines for self isolation/ ending isolation.  Advised of safe practice guidelines. Symptom Tier reviewed.  Encouraged to monitor for any worsening symptoms; watch for increased shortness of breath, weakness, and signs of dehydration. Instructed to rest and hydrate well.  Advised to wear a mask if necessary to leave the house.

## 2021-07-27 ENCOUNTER — Ambulatory Visit: Payer: Commercial Managed Care - PPO | Admitting: Dermatology

## 2021-11-08 ENCOUNTER — Encounter: Payer: Self-pay | Admitting: *Deleted

## 2021-12-28 ENCOUNTER — Encounter: Payer: Commercial Managed Care - PPO | Admitting: Family Medicine

## 2022-01-27 ENCOUNTER — Encounter: Payer: Self-pay | Admitting: *Deleted
